# Patient Record
Sex: Female | Born: 1995 | Hispanic: No | Marital: Married | Smoking: Never smoker
Health system: Southern US, Community
[De-identification: ages and names within clinical notes are randomized; demographics above are authoritative.]

## PROBLEM LIST (undated history)

## (undated) DIAGNOSIS — B1910 Unspecified viral hepatitis B without hepatic coma: Secondary | ICD-10-CM

## (undated) HISTORY — DX: Unspecified viral hepatitis B without hepatic coma: B19.10

---
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
## 2015-01-08 ENCOUNTER — Other Ambulatory Visit: Payer: Medicaid Other

## 2015-01-08 DIAGNOSIS — B181 Chronic viral hepatitis B without delta-agent: Secondary | ICD-10-CM

## 2015-01-08 LAB — COMPREHENSIVE METABOLIC PANEL
ALT: 21 U/L (ref 0–35)
AST: 21 U/L (ref 0–37)
Albumin: 3.9 g/dL (ref 3.5–5.2)
Alkaline Phosphatase: 98 U/L (ref 39–117)
BUN: 10 mg/dL (ref 6–23)
CALCIUM: 9.1 mg/dL (ref 8.4–10.5)
CHLORIDE: 104 meq/L (ref 96–112)
CO2: 25 mEq/L (ref 19–32)
Creat: 0.61 mg/dL (ref 0.50–1.10)
Glucose, Bld: 101 mg/dL — ABNORMAL HIGH (ref 70–99)
POTASSIUM: 4.2 meq/L (ref 3.5–5.3)
Sodium: 141 mEq/L (ref 135–145)
Total Bilirubin: 0.3 mg/dL (ref 0.2–1.1)
Total Protein: 6.8 g/dL (ref 6.0–8.3)

## 2015-01-10 LAB — HEPATITIS B E ANTIGEN: Hepatitis Be Antigen: NONREACTIVE

## 2015-01-10 LAB — HEPATITIS B E ANTIBODY: Hepatitis Be Antibody: REACTIVE — AB

## 2015-01-10 LAB — HEPATITIS B DNA, ULTRAQUANTITATIVE, PCR
HEPATITIS B DNA: 17607 [IU]/mL — AB (ref <20)
Hepatitis B DNA (Calc): 102473 copies/mL — ABNORMAL HIGH (ref <116)

## 2015-01-22 ENCOUNTER — Ambulatory Visit (INDEPENDENT_AMBULATORY_CARE_PROVIDER_SITE_OTHER): Payer: Medicaid Other | Admitting: Internal Medicine

## 2015-01-22 DIAGNOSIS — B181 Chronic viral hepatitis B without delta-agent: Secondary | ICD-10-CM | POA: Diagnosis present

## 2015-01-22 NOTE — Progress Notes (Signed)
Patient ID: Brandi Cervantes, female   DOB: May 21, 1996, 19 y.o.   MRN: [id number]         [facility] for Infectious Disease  Patient Active Problem List   Diagnosis Date Noted  . Chronic hepatitis B 09/10/2012    Priority: High  . Yeast vaginitis 03/22/2014  . Lumbar strain 10/20/2013  . GERD (gastroesophageal reflux disease) 09/26/2012  . Anemia 09/26/2012  . Language barrier, cultural differences 09/10/2012  . Supervision of other high-risk pregnancy(V23.89) 09/10/2012  . Previous cesarean delivery affecting pregnancy, antepartum 09/07/2012    Patient's Medications  New Prescriptions   No medications on file  Previous Medications   FLUCONAZOLE (DIFLUCAN) 150 MG TABLET    Take 1 tablet (150 mg total) by mouth once.   IBUPROFEN (ADVIL,MOTRIN) 800 MG TABLET    Take 1 tablet (800 mg total) by mouth every 8 (eight) hours.   PRENATAL VIT-FE FUMARATE-FA (PRENATAL MULTIVITAMIN) TABS    Take 1 tablet by mouth daily at 12 noon.   TERCONAZOLE (TERAZOL 7) 0.4 % VAGINAL CREAM    Place 1 applicator vaginally at bedtime.  Modified Medications   No medications on file  Discontinued Medications   No medications on file    Subjective: Brandi Cervantes is in for her routine chronic hepatitis B followup visit with her husband and 2 children. She has been a little more fatigued recently. They recently lost their childcare so she had to stop school and stay home more. As a result she has been getting less activity and eating more and gaining weight.  Past Medical History  Diagnosis Date  . Hepatitis B     diagnosed in pregnancy    History  Substance Use Topics  . Smoking status: Never Smoker   . Smokeless tobacco: Never Used  . Alcohol Use: No    No family history on file.  No Known Allergies  Objective: Temp: 97.8 F (36.6 C) (07/26 1512) Temp Source: Oral (07/26 1512) BP: 110/68 mmHg (07/26 1512) Pulse Rate: 88 (07/26 1512)  Hepatitis B e antigen negative Hepatitis B e antibody  positive Hepatitis B DNA viral load 17607 international units/mL  CMP     Component Value Date/Time   NA 141 01/08/2015 1113   K 4.2 01/08/2015 1113   CL 104 01/08/2015 1113   CO2 25 01/08/2015 1113   GLUCOSE 101* 01/08/2015 1113   BUN 10 01/08/2015 1113   CREATININE 0.61 01/08/2015 1113   CALCIUM 9.1 01/08/2015 1113   PROT 6.8 01/08/2015 1113   ALBUMIN 3.9 01/08/2015 1113   AST 21 01/08/2015 1113   ALT 21 01/08/2015 1113   ALKPHOS 98 01/08/2015 1113   BILITOT 0.3 01/08/2015 1113   GFRNONAA >89 03/06/2014 1407   GFRAA >89 03/06/2014 1407     ULTRASOUND HEPATIC ELASTOGRAPHY 04/05/2014 TECHNIQUE:   IMPRESSION:  1. Normal sonographic appearance of the abdomen and liver.  Median hepatic shear wave velocity is calculated at 1.02 m/sec.  Corresponding Metavir fibrosis score is F0/F1.  Risk of fibrosis is minimal.  Follow-up: None required  Electronically Signed  By: Herbie Baltimore M.D.  On: 04/05/2014 10:38     Assessment: She has chronic e antigen negative hepatitis B. Given her young age, normal liver enzymes and normal ultrasound I will continue observation and repeat lab work every 6 months.   Plan: 1. followup after lab work in 6 months    Cliffton Asters, MD [facility] for Infectious Disease [facility] [phone] pager   [phone] cell  01/22/2015, 3:32 PM

## 2015-07-16 ENCOUNTER — Other Ambulatory Visit: Payer: Medicaid Other

## 2015-07-16 DIAGNOSIS — B181 Chronic viral hepatitis B without delta-agent: Secondary | ICD-10-CM

## 2015-07-16 LAB — COMPREHENSIVE METABOLIC PANEL
ALT: 15 U/L (ref 5–32)
AST: 16 U/L (ref 12–32)
Albumin: 3.7 g/dL (ref 3.6–5.1)
Alkaline Phosphatase: 90 U/L (ref 47–176)
BILIRUBIN TOTAL: 0.4 mg/dL (ref 0.2–1.1)
BUN: 9 mg/dL (ref 7–20)
CHLORIDE: 104 mmol/L (ref 98–110)
CO2: 27 mmol/L (ref 20–31)
CREATININE: 0.59 mg/dL (ref 0.50–1.00)
Calcium: 9.1 mg/dL (ref 8.9–10.4)
Glucose, Bld: 84 mg/dL (ref 65–99)
Potassium: 4.1 mmol/L (ref 3.8–5.1)
SODIUM: 140 mmol/L (ref 135–146)
TOTAL PROTEIN: 6.5 g/dL (ref 6.3–8.2)

## 2015-07-18 LAB — HEPATITIS B E ANTIGEN: HEPATITIS BE ANTIGEN: NONREACTIVE

## 2015-07-18 LAB — HEPATITIS B DNA, ULTRAQUANTITATIVE, PCR
Hepatitis B DNA (Calc): 4.55 Log IU/mL — ABNORMAL HIGH (ref <1.30)
Hepatitis B DNA: 35767 IU/mL — ABNORMAL HIGH (ref <20)

## 2015-07-18 LAB — HEPATITIS B E ANTIBODY: HEPATITIS BE ANTIBODY: REACTIVE — AB

## 2015-07-30 ENCOUNTER — Encounter: Payer: Self-pay | Admitting: Internal Medicine

## 2015-07-30 ENCOUNTER — Ambulatory Visit (INDEPENDENT_AMBULATORY_CARE_PROVIDER_SITE_OTHER): Payer: Medicaid Other | Admitting: Internal Medicine

## 2015-07-30 DIAGNOSIS — Z23 Encounter for immunization: Secondary | ICD-10-CM

## 2015-07-30 DIAGNOSIS — B181 Chronic viral hepatitis B without delta-agent: Secondary | ICD-10-CM | POA: Diagnosis not present

## 2015-07-30 NOTE — Progress Notes (Signed)
Patient ID: Brandi Cervantes, female   DOB: 1995/09/24, 20 y.o.   MRN: [id number] Patient ID: Brandi Cervantes, female   DOB: [birth year], 20 y.o.   MRN: [id number]         [facility] for Infectious Disease  Patient Active Problem List   Diagnosis Date Noted  . Chronic hepatitis B (HCC) 09/10/2012    Priority: High  . Yeast vaginitis 03/22/2014  . Lumbar strain 10/20/2013  . GERD (gastroesophageal reflux disease) 09/26/2012  . Anemia 09/26/2012  . Language barrier, cultural differences 09/10/2012  . Supervision of other high-risk pregnancy(V23.89) 09/10/2012  . Previous cesarean delivery affecting pregnancy, antepartum 09/07/2012    Patient's Medications  New Prescriptions   No medications on file  Previous Medications   FLUCONAZOLE (DIFLUCAN) 150 MG TABLET    Take 1 tablet (150 mg total) by mouth once.   IBUPROFEN (ADVIL,MOTRIN) 800 MG TABLET    Take 1 tablet (800 mg total) by mouth every 8 (eight) hours.   PRENATAL VIT-FE FUMARATE-FA (PRENATAL MULTIVITAMIN) TABS    Take 1 tablet by mouth daily at 12 noon.   TERCONAZOLE (TERAZOL 7) 0.4 % VAGINAL CREAM    Place 1 applicator vaginally at bedtime.  Modified Medications   No medications on file  Discontinued Medications   No medications on file    Subjective: Brandi Cervantes is in for her routine chronic hepatitis B followup visit with her husband and 2 children. She is feeling well. She's not had any abdominal pain, nausea or vomiting.  Past Medical History  Diagnosis Date  . Hepatitis B     diagnosed in pregnancy    Social History  Substance Use Topics  . Smoking status: Never Smoker   . Smokeless tobacco: Never Used  . Alcohol Use: No    No family history on file.  No Known Allergies  Objective: Temp: 98 F (36.7 C) (01/31 1606) Temp Source: Oral (01/31 1606) BP: 114/70 mmHg (01/31 1606) Pulse Rate: 88 (01/31 1606)   She is smiling and in good spirits as usual Abdomen: Soft and nontender without palpable liver edge  Lab  work 07/16/2015 Hepatitis B e antigen negative Hepatitis B e antibody positive Hepatitis B DNA viral load 35767 international units/mL  CMP     Component Value Date/Time   NA 140 07/16/2015 1553   K 4.1 07/16/2015 1553   CL 104 07/16/2015 1553   CO2 27 07/16/2015 1553   GLUCOSE 84 07/16/2015 1553   BUN 9 07/16/2015 1553   CREATININE 0.59 07/16/2015 1553   CALCIUM 9.1 07/16/2015 1553   PROT 6.5 07/16/2015 1553   ALBUMIN 3.7 07/16/2015 1553   AST 16 07/16/2015 1553   ALT 15 07/16/2015 1553   ALKPHOS 90 07/16/2015 1553   BILITOT 0.4 07/16/2015 1553   GFRNONAA >89 03/06/2014 1407   GFRAA >89 03/06/2014 1407     ULTRASOUND HEPATIC ELASTOGRAPHY 04/05/2014 TECHNIQUE:   IMPRESSION:  1. Normal sonographic appearance of the abdomen and liver.  Median hepatic shear wave velocity is calculated at 1.02 m/sec.  Corresponding Metavir fibrosis score is F0/F1.  Risk of fibrosis is minimal.  Follow-up: None required  Electronically Signed  By: Herbie Baltimore M.D.  On: 04/05/2014 10:38     Assessment: She has chronic e antigen negative hepatitis B. Given her young age, normal liver enzymes and stable viral load I will continue observation and repeat lab work every 6 months.   Plan: 1. followup after lab work in 6 months    Cliffton Asters, MD  [facility] [phone] pager   [phone] cell 07/30/2015, 4:27 PM

## 2016-03-03 ENCOUNTER — Other Ambulatory Visit: Payer: Medicaid Other

## 2016-03-17 ENCOUNTER — Ambulatory Visit: Payer: Medicaid Other | Admitting: Internal Medicine

## 2016-04-02 ENCOUNTER — Other Ambulatory Visit: Payer: Medicaid Other

## 2016-04-02 DIAGNOSIS — B181 Chronic viral hepatitis B without delta-agent: Secondary | ICD-10-CM

## 2016-04-03 LAB — COMPREHENSIVE METABOLIC PANEL
ALBUMIN: 4 g/dL (ref 3.6–5.1)
ALK PHOS: 91 U/L (ref 33–115)
ALT: 14 U/L (ref 6–29)
AST: 19 U/L (ref 10–30)
BILIRUBIN TOTAL: 0.3 mg/dL (ref 0.2–1.2)
BUN: 16 mg/dL (ref 7–25)
CHLORIDE: 104 mmol/L (ref 98–110)
CO2: 22 mmol/L (ref 20–31)
CREATININE: 0.79 mg/dL (ref 0.50–1.10)
Calcium: 9.7 mg/dL (ref 8.6–10.2)
Glucose, Bld: 92 mg/dL (ref 65–99)
Potassium: 4.1 mmol/L (ref 3.5–5.3)
SODIUM: 140 mmol/L (ref 135–146)
TOTAL PROTEIN: 6.9 g/dL (ref 6.1–8.1)

## 2016-04-03 LAB — HEPATITIS B DNA, ULTRAQUANTITATIVE, PCR
HEPATITIS B DNA: 60880 [IU]/mL — AB (ref <20)
Hepatitis B DNA (Calc): 4.78 Log IU/mL — ABNORMAL HIGH (ref <1.30)

## 2016-04-06 LAB — HEPATITIS B E ANTIBODY: Hepatitis Be Antibody: REACTIVE — AB

## 2016-04-06 LAB — HEPATITIS B E ANTIGEN: Hepatitis Be Antigen: NONREACTIVE

## 2016-04-16 ENCOUNTER — Ambulatory Visit: Payer: Medicaid Other | Admitting: Internal Medicine

## 2016-04-20 ENCOUNTER — Ambulatory Visit (INDEPENDENT_AMBULATORY_CARE_PROVIDER_SITE_OTHER): Payer: Medicaid Other | Admitting: Internal Medicine

## 2016-04-20 DIAGNOSIS — B181 Chronic viral hepatitis B without delta-agent: Secondary | ICD-10-CM | POA: Diagnosis present

## 2016-04-20 DIAGNOSIS — Z23 Encounter for immunization: Secondary | ICD-10-CM

## 2016-04-20 NOTE — Progress Notes (Signed)
Patient ID: Brandi Cervantes, female   DOB: 07-Feb-1996, 20 y.o.   MRN: [id number]         [facility] for Infectious Disease  Patient Active Problem List   Diagnosis Date Noted  . Chronic hepatitis B (HCC) 09/10/2012    Priority: High  . Yeast vaginitis 03/22/2014  . Lumbar strain 10/20/2013  . GERD (gastroesophageal reflux disease) 09/26/2012  . Anemia 09/26/2012  . Language barrier, cultural differences 09/10/2012  . Supervision of other high-risk pregnancy(V23.89) 09/10/2012  . Previous cesarean delivery affecting pregnancy, antepartum 09/07/2012    Patient's Medications  New Prescriptions   No medications on file  Previous Medications   FLUCONAZOLE (DIFLUCAN) 150 MG TABLET    Take 1 tablet (150 mg total) by mouth once.   IBUPROFEN (ADVIL,MOTRIN) 800 MG TABLET    Take 1 tablet (800 mg total) by mouth every 8 (eight) hours.   PRENATAL VIT-FE FUMARATE-FA (PRENATAL MULTIVITAMIN) TABS    Take 1 tablet by mouth daily at 12 noon.   TERCONAZOLE (TERAZOL 7) 0.4 % VAGINAL CREAM    Place 1 applicator vaginally at bedtime.  Modified Medications   No medications on file  Discontinued Medications   No medications on file    Subjective: Marguetta is in for her routine chronic hepatitis B followup visit with her husband and 2 children. She is feeling well. She's not had any abdominal pain, nausea or vomiting. She occasionally has some back pain. It is mild. She believes it is probably related to carrying her children.  Past Medical History:  Diagnosis Date  . Hepatitis B    diagnosed in pregnancy    Social History  Substance Use Topics  . Smoking status: Never Smoker  . Smokeless tobacco: Never Used  . Alcohol use No    No family history on file.  No Known Allergies  Objective: Temp: 98.3 F (36.8 C) (10/23 1613) Temp Source: Oral (10/23 1613) BP: 108/72 (10/23 1613) Pulse Rate: 84 (10/23 1613)   She is smiling and in good spirits as usual Abdomen: Soft and nontender  without palpable liver edge  Lab work 04/02/2016 Hepatitis B e antigen negative Hepatitis B e antibody positive Hepatitis B DNA viral load 60880 international units/mL  CMP     Component Value Date/Time   NA 140 04/02/2016 1543   K 4.1 04/02/2016 1543   CL 104 04/02/2016 1543   CO2 22 04/02/2016 1543   GLUCOSE 92 04/02/2016 1543   BUN 16 04/02/2016 1543   CREATININE 0.79 04/02/2016 1543   CALCIUM 9.7 04/02/2016 1543   PROT 6.9 04/02/2016 1543   ALBUMIN 4.0 04/02/2016 1543   AST 19 04/02/2016 1543   ALT 14 04/02/2016 1543   ALKPHOS 91 04/02/2016 1543   BILITOT 0.3 04/02/2016 1543   GFRNONAA >89 03/06/2014 1407   GFRAA >89 03/06/2014 1407     ULTRASOUND HEPATIC ELASTOGRAPHY 04/05/2014 TECHNIQUE:   IMPRESSION:  1. Normal sonographic appearance of the abdomen and liver.  Median hepatic shear wave velocity is calculated at 1.02 m/sec.  Corresponding Metavir fibrosis score is F0/F1.  Risk of fibrosis is minimal.  Follow-up: None required  Electronically Signed  By: Herbie Baltimore M.D.  On: 04/05/2014 10:38     Assessment: She has chronic e antigen negative hepatitis B. Given her young age, normal liver enzymes and relatively stable viral load I will continue observation and repeat lab work every 6 months.   Plan: 1. followup after lab work in 6 months    Jonny Ruiz  Orvan Falconerampbell, MD [facility] [phone] pager   [phone] cell 04/20/2016, 4:34 PM

## 2016-09-28 ENCOUNTER — Encounter: Payer: Self-pay | Admitting: Obstetrics & Gynecology

## 2016-09-28 ENCOUNTER — Ambulatory Visit (INDEPENDENT_AMBULATORY_CARE_PROVIDER_SITE_OTHER): Payer: Medicaid Other | Admitting: Obstetrics & Gynecology

## 2016-09-28 VITALS — BP 124/73 | HR 101 | Wt 173.7 lb

## 2016-09-28 DIAGNOSIS — N938 Other specified abnormal uterine and vaginal bleeding: Secondary | ICD-10-CM

## 2016-09-28 MED ORDER — NORGESTREL-ETHINYL ESTRADIOL 0.3-30 MG-MCG PO TABS: 1.0000 | ORAL_TABLET | Freq: Every day | ORAL | 11 refills | Status: DC

## 2016-09-28 NOTE — Progress Notes (Addendum)
   Subjective:    Patient ID: Brandi Cervantes, female    DOB: 02-25-1996, 21 y.o.   MRN: [id number]  HPI 49 M [place] P2 (5 and 4 yo kids) here today with the issue of  With LBP for 1 month and the issue of long periods for up to 2 weeks per month. She has a Mirena IUD for about 4 years, since last delivery.  She would also like her IUD strings cut as her husband feels them with sex.  Review of Systems     Objective:   Physical Exam WNWHAFNAD Breathing, conversing, and ambulating normally NSSA, NT, mobile, no adnexal masses IUD strings cut flush with os     Assessment & Plan:  DUB with Mirena- check TSH, cbc I have reassured her that if her labs are normal then the cause of the DUB is the Mirena. I have offered to prescribe OCPs I will recheck her LFTs in a month due to her chronic hep B

## 2016-10-01 ENCOUNTER — Other Ambulatory Visit: Payer: Medicaid Other

## 2016-10-01 DIAGNOSIS — B181 Chronic viral hepatitis B without delta-agent: Secondary | ICD-10-CM

## 2016-10-01 LAB — COMPREHENSIVE METABOLIC PANEL
ALBUMIN: 4 g/dL (ref 3.6–5.1)
ALT: 20 U/L (ref 6–29)
AST: 19 U/L (ref 10–30)
Alkaline Phosphatase: 78 U/L (ref 33–115)
BILIRUBIN TOTAL: 0.4 mg/dL (ref 0.2–1.2)
BUN: 16 mg/dL (ref 7–25)
CHLORIDE: 106 mmol/L (ref 98–110)
CO2: 26 mmol/L (ref 20–31)
CREATININE: 0.77 mg/dL (ref 0.50–1.10)
Calcium: 8.9 mg/dL (ref 8.6–10.2)
Glucose, Bld: 97 mg/dL (ref 65–99)
Potassium: 4.1 mmol/L (ref 3.5–5.3)
SODIUM: 139 mmol/L (ref 135–146)
TOTAL PROTEIN: 6.7 g/dL (ref 6.1–8.1)

## 2016-10-04 LAB — HEPATITIS B DNA, ULTRAQUANTITATIVE, PCR
Hepatitis B DNA (Calc): 4.7 Log IU/mL — ABNORMAL HIGH
Hepatitis B DNA: 50500 IU/mL — ABNORMAL HIGH

## 2016-10-05 ENCOUNTER — Other Ambulatory Visit: Payer: Medicaid Other

## 2016-10-05 LAB — HEPATITIS B E ANTIGEN: HEPATITIS BE ANTIGEN: NONREACTIVE

## 2016-10-05 LAB — HEPATITIS B E ANTIBODY: Hepatitis Be Antibody: REACTIVE — AB

## 2016-10-19 ENCOUNTER — Ambulatory Visit: Payer: Medicaid Other | Admitting: Internal Medicine

## 2016-10-23 ENCOUNTER — Ambulatory Visit (INDEPENDENT_AMBULATORY_CARE_PROVIDER_SITE_OTHER): Payer: Medicaid Other | Admitting: Obstetrics & Gynecology

## 2016-10-23 VITALS — BP 114/69 | HR 84 | Wt 172.0 lb

## 2016-10-23 DIAGNOSIS — N938 Other specified abnormal uterine and vaginal bleeding: Secondary | ICD-10-CM

## 2016-10-23 NOTE — Progress Notes (Signed)
Pt reports that bleeding has improved on birth control pills.

## 2016-10-23 NOTE — Progress Notes (Signed)
   Subjective:    Patient ID: Namiko Pritts, female    DOB: May 12, 1996, 21 y.o.   MRN: [id number]  HPI 21 yo M lady here for follow up for DUB with Mirena. I gave her a prescription for OCPs which she started taking. Her bleeding resolved. However, she spoke with her mother [place] who convinced her that the OCPs might be harmful for her so she stopped taking them. Her husband is requesting a new Mirena (hers is 5 1/21 years old).   Review of Systems     Objective:   Physical Exam WNWHAFNAD Breathing, conversing, and ambulating normally        Assessment & Plan:  LFTs (H/o Hep B), CBC, TSH (as these were not drawn at her last visit) I have offered her watchful waiting, use of OCPs along with Mirena to help her DUB.  He would like her to have a new IUD as soon as the old one expires

## 2016-10-24 LAB — CBC
Hematocrit: 40.4 % (ref 34.0–46.6)
Hemoglobin: 13.3 g/dL (ref 11.1–15.9)
MCH: 28.5 pg (ref 26.6–33.0)
MCHC: 32.9 g/dL (ref 31.5–35.7)
MCV: 87 fL (ref 79–97)
PLATELETS: 267 10*3/uL (ref 150–379)
RBC: 4.66 x10E6/uL (ref 3.77–5.28)
RDW: 13.7 % (ref 12.3–15.4)
WBC: 5 10*3/uL (ref 3.4–10.8)

## 2016-10-24 LAB — ALT: ALT: 35 IU/L — ABNORMAL HIGH (ref 0–32)

## 2016-10-24 LAB — AST: AST: 23 IU/L (ref 0–40)

## 2016-10-24 LAB — TSH: TSH: 1.32 u[IU]/mL (ref 0.450–4.500)

## 2016-10-29 ENCOUNTER — Ambulatory Visit (INDEPENDENT_AMBULATORY_CARE_PROVIDER_SITE_OTHER): Payer: Medicaid Other | Admitting: Internal Medicine

## 2016-10-29 ENCOUNTER — Encounter: Payer: Self-pay | Admitting: Internal Medicine

## 2016-10-29 DIAGNOSIS — B181 Chronic viral hepatitis B without delta-agent: Secondary | ICD-10-CM

## 2016-10-29 NOTE — Progress Notes (Signed)
[facility]  Patient Active Problem List   Diagnosis Date Noted  . Chronic hepatitis B (HCC) 09/10/2012    Priority: High  . Yeast vaginitis 03/22/2014  . Lumbar strain 10/20/2013  . GERD (gastroesophageal reflux disease) 09/26/2012  . Anemia 09/26/2012  . Language barrier, cultural differences 09/10/2012  . Supervision of other high-risk pregnancy(V23.89) 09/10/2012  . Previous cesarean delivery affecting pregnancy, antepartum 09/07/2012    Patient's Medications  New Prescriptions   No medications on file  Previous Medications   IBUPROFEN (ADVIL,MOTRIN) 800 MG TABLET    Take 1 tablet (800 mg total) by mouth every 8 (eight) hours.   NORGESTREL-ETHINYL ESTRADIOL (LO/OVRAL,CRYSELLE) 0.3-30 MG-MCG TABLET    Take 1 tablet by mouth daily.  Modified Medications   No medications on file  Discontinued Medications   No medications on file    Subjective: Erick Blinksuqa is in with her husband for her routine follow-up visit. She has occasional epigastric and back pain that she believes is related to standing at her work. It is not very severe and she does not need to take anything for it. She's not having any nausea, vomiting, indigestion and her appetite is good. She states that she and her husband both need to work and she is worried that she will lose her Medicaid soon.  Review of Systems: Review of Systems  Constitutional: Negative for chills, diaphoresis, fever and weight loss.  Gastrointestinal: Positive for abdominal pain. Negative for constipation, diarrhea, heartburn, nausea and vomiting.    Past Medical History:  Diagnosis Date  . Hepatitis B    diagnosed in pregnancy    Social History  Substance Use Topics  . Smoking status: Never Smoker  . Smokeless tobacco: Never Used  . Alcohol use No    No family history on file.  No Known Allergies  Objective: Vitals:   10/29/16 0918  Weight: 168 lb (76.2 kg)   There is no height or weight  on file to calculate BMI.  Physical Exam  Constitutional: She is oriented to person, place, and time.  She is smiling and in good spirits as usual.  Abdominal: Soft. She exhibits no distension and no mass. There is no tenderness.  Neurological: She is alert and oriented to person, place, and time.  Skin: No rash noted.  Psychiatric: Mood and affect normal.    Lab Results CMP     Component Value Date/Time   NA 139 10/01/2016 1606   K 4.1 10/01/2016 1606   CL 106 10/01/2016 1606   CO2 26 10/01/2016 1606   GLUCOSE 97 10/01/2016 1606   BUN 16 10/01/2016 1606   CREATININE 0.77 10/01/2016 1606   CALCIUM 8.9 10/01/2016 1606   PROT 6.7 10/01/2016 1606   ALBUMIN 4.0 10/01/2016 1606   AST 23 10/23/2016 1057   ALT 35 (H) 10/23/2016 1057   ALKPHOS 78 10/01/2016 1606   BILITOT 0.4 10/01/2016 1606   GFRNONAA >89 03/06/2014 1407   GFRAA >89 03/06/2014 1407  Hepatitis B e antigen 10/01/2016: Negative Hepatitis B e antibody 10/01/2016: Positive Hepatitis bDNA viral load 10/01/2016 35,767 IU/ml   Problem List Items Addressed This Visit      High   Chronic hepatitis B (HCC)    She has chronic hepatitis B with persistent e antigen negative status. She has moderate viral activation and for the first time since I have been seeing her she has some very mild elevation of her ALT. She had  a normal ultrasound with elastography 3 years ago. I will repeat that now and see her back after repeat liver enzymes in 2 months.      Relevant Orders   US ABDOMEN COMPLETE W/ELASTOGRAPHY   Comprehensive metabolic panel       Cliffton Asters, MD [facility] for Infectious Disease [facility] [phone] pager   [phone] cell 10/29/2016, 9:37 AM

## 2016-10-29 NOTE — Assessment & Plan Note (Signed)
She has chronic hepatitis B with persistent e antigen negative status. She has moderate viral activation and for the first time since I have been seeing her she has some very mild elevation of her ALT. She had a normal ultrasound with elastography 3 years ago. I will repeat that now and see her back after repeat liver enzymes in 2 months.

## 2016-11-13 DIAGNOSIS — N938 Other specified abnormal uterine and vaginal bleeding: Secondary | ICD-10-CM | POA: Insufficient documentation

## 2016-11-13 DIAGNOSIS — B181 Chronic viral hepatitis B without delta-agent: Secondary | ICD-10-CM | POA: Diagnosis not present

## 2016-11-13 DIAGNOSIS — M62830 Muscle spasm of back: Secondary | ICD-10-CM | POA: Diagnosis not present

## 2016-11-25 ENCOUNTER — Telehealth: Payer: Self-pay | Admitting: *Deleted

## 2016-11-25 NOTE — Telephone Encounter (Signed)
I have checked with Samule DryAmy Barnes in revenue cycle/billing.  She said Medicaid will cover insertion of another device.

## 2016-11-25 NOTE — Telephone Encounter (Addendum)
Per Dr. Marice Potterove patient needs to stop taking birth control pills because her AST level has increased. Attempted to call patient but was unable to reach her. (Per Dr. Marice Potterove patient will likely want her iud replaced as she thinks it is the reason that she is beginning to bleed. Need to check with Tresa EndoKelly if Medicaid will cover early reinsertion, current iud has been in place for 3 years.)

## 2016-11-30 NOTE — Telephone Encounter (Addendum)
Called pt and left message stating that I am calling with test result information and recommendation for plan of care from Dr. Marice Potterove. Please call back and leave message stating whether a detailed message can be left on her voicemail.  6/6  0930  Pt and husband presented to office today in response to my telephone message. I informed pt of test result information regarding slightly elevated liver enzyme and need to stop taking OCP's. Pt was already aware of this information from recent visit to ID clinic w/Dr. Orvan Falconerampbell and she has stopped taking the OCP's. I also informed pt that we have verified that Medicaid will cover the cost of a replacement IUD.  Pt and husband were ecstatic with this information and requested the soonest appt possible with any provider. Appt was given for tomorrow @ 0940.

## 2016-12-03 ENCOUNTER — Ambulatory Visit: Payer: Medicaid Other | Admitting: Obstetrics & Gynecology

## 2016-12-03 ENCOUNTER — Telehealth: Payer: Self-pay | Admitting: *Deleted

## 2016-12-03 NOTE — Telephone Encounter (Signed)
PA initiated for patient's Abdominal ultrasound complete with elastography.  [employer] requests additional clinical information to be faxed to [phone].  RN faxed last 2 years of notes, last elastography report and labs as directed with patient name, date of birth, and case ID [id number] on each page. Request will be approved/denied within 48 business hours. Patient's elastography scheduled for 6/14 pending outcome. Andree CossHowell, Jonel Sick M, RN

## 2016-12-07 ENCOUNTER — Telehealth: Payer: Self-pay | Admitting: *Deleted

## 2016-12-07 ENCOUNTER — Ambulatory Visit (INDEPENDENT_AMBULATORY_CARE_PROVIDER_SITE_OTHER): Payer: Medicaid Other | Admitting: Obstetrics & Gynecology

## 2016-12-07 ENCOUNTER — Encounter: Payer: Self-pay | Admitting: Obstetrics & Gynecology

## 2016-12-07 VITALS — BP 108/66 | HR 72 | Wt 177.6 lb

## 2016-12-07 DIAGNOSIS — Z30433 Encounter for removal and reinsertion of intrauterine contraceptive device: Secondary | ICD-10-CM | POA: Diagnosis present

## 2016-12-07 MED ORDER — LEVONORGESTREL 18.6 MCG/DAY IU IUD
INTRAUTERINE_SYSTEM | Freq: Once | INTRAUTERINE | Status: AC
  Administered 2016-12-07: 16:00:00 via INTRAUTERINE

## 2016-12-07 NOTE — Progress Notes (Signed)
   Subjective:    Patient ID: Brandi Cervantes, female    DOB: 03/20/1996, 21 y.o.   MRN: [id number]  HPI 21 yo MAP2 here for a new Mirena.   Review of Systems     Objective:   Physical Exam Consent signed, Time out procedure done. I removed the intact IUD easily. Cervix prepped with betadine and grasped with a single tooth tenaculum. Liletta was easily placed and the strings were cut to 3-4 cm. Uterus sounded to 9 cm. She tolerated the procedure well.  +    Assessment & Plan:  Contraception- Mirena RTC 2 month

## 2016-12-07 NOTE — Telephone Encounter (Signed)
Wanting to find out about ABD U/S scheduling/approval.  RN advised that Medicaid is reviewing the patient's medical records prior to approving payment for the ABD U/S.  It may take until tomorrow, 12/08/16, [facility]  [facility] will call the patient when we hear from Medicaid.  RN noted that the patient has an appointment scheduled with [facility] for this afternoon.  The notes section of the appointment said that the patient was being seen re: IUD replacement.  Patient acknowledged that appt for this afternoon.

## 2016-12-08 NOTE — Telephone Encounter (Signed)
Ultrasound approved. RN left message for patient confirming appointment.

## 2016-12-08 NOTE — Telephone Encounter (Signed)
Ultrasound approved, left message for patient confirming her appointment.   Approval [id number].

## 2016-12-10 ENCOUNTER — Ambulatory Visit (HOSPITAL_COMMUNITY)
Admission: RE | Admit: 2016-12-10 | Discharge: 2016-12-10 | Disposition: A | Discharge status: Home / Self Care | Payer: Medicaid Other | Source: Ambulatory Visit | Attending: Internal Medicine | Admitting: Internal Medicine

## 2016-12-10 DIAGNOSIS — B181 Chronic viral hepatitis B without delta-agent: Secondary | ICD-10-CM | POA: Insufficient documentation

## 2016-12-22 ENCOUNTER — Ambulatory Visit (INDEPENDENT_AMBULATORY_CARE_PROVIDER_SITE_OTHER): Payer: Medicaid Other | Admitting: Internal Medicine

## 2016-12-22 ENCOUNTER — Encounter: Payer: Self-pay | Admitting: Internal Medicine

## 2016-12-22 DIAGNOSIS — B181 Chronic viral hepatitis B without delta-agent: Secondary | ICD-10-CM

## 2016-12-22 NOTE — Progress Notes (Signed)
[facility]  Patient Active Problem List   Diagnosis Date Noted  . Chronic hepatitis B (HCC) 09/10/2012    Priority: High  . Yeast vaginitis 03/22/2014  . Lumbar strain 10/20/2013  . GERD (gastroesophageal reflux disease) 09/26/2012  . Anemia 09/26/2012  . Language barrier, cultural differences 09/10/2012  . Supervision of other high-risk pregnancy(V23.89) 09/10/2012  . Previous cesarean delivery affecting pregnancy, antepartum 09/07/2012    Patient's Medications  New Prescriptions   No medications on file  Previous Medications   IBUPROFEN (ADVIL,MOTRIN) 800 MG TABLET    Take 1 tablet (800 mg total) by mouth every 8 (eight) hours.  Modified Medications   No medications on file  Discontinued Medications   NORGESTREL-ETHINYL ESTRADIOL (LO/OVRAL,CRYSELLE) 0.3-30 MG-MCG TABLET    Take 1 tablet by mouth daily.    Subjective: Brandi Cervantes is in with her husband for her routine follow-up visit. She has occasional epigastric and back pain that she believes is related to standing at her work. It is not very severe and she does not need to take anything for it. She's not having any nausea, vomiting, indigestion and her appetite is good. She states that she and her husband both need to work and she is worried that she will lose her Medicaid soon.  Review of Systems: Review of Systems  Constitutional: Negative for chills, diaphoresis, fever and weight loss.  Gastrointestinal: Positive for abdominal pain. Negative for constipation, diarrhea, heartburn, nausea and vomiting.    Past Medical History:  Diagnosis Date  . Hepatitis B    diagnosed in pregnancy    Social History  Substance Use Topics  . Smoking status: Never Smoker  . Smokeless tobacco: Never Used  . Alcohol use No    No family history on file.  No Known Allergies  Objective: Vitals:   12/22/16 1002  BP: 103/69  Pulse: 75  Temp: 97.7 F (36.5 C)  TempSrc: Oral  Weight: 171 lb  (77.6 kg)  Height: 5\' 1"  (1.549 m)   Body mass index is 32.31 kg/m.  Physical Exam  Constitutional: She is oriented to person, place, and time.  She is smiling and in good spirits as usual.  Abdominal: Soft. She exhibits no distension and no mass. There is no tenderness.  Neurological: She is alert and oriented to person, place, and time.  Skin: No rash noted.  Psychiatric: Mood and affect normal.    Lab Results CMP     Component Value Date/Time   NA 139 10/01/2016 1606   K 4.1 10/01/2016 1606   CL 106 10/01/2016 1606   CO2 26 10/01/2016 1606   GLUCOSE 97 10/01/2016 1606   BUN 16 10/01/2016 1606   CREATININE 0.77 10/01/2016 1606   CALCIUM 8.9 10/01/2016 1606   PROT 6.7 10/01/2016 1606   ALBUMIN 4.0 10/01/2016 1606   AST 23 10/23/2016 1057   ALT 35 (H) 10/23/2016 1057   ALKPHOS 78 10/01/2016 1606   BILITOT 0.4 10/01/2016 1606   GFRNONAA >89 03/06/2014 1407   GFRAA >89 03/06/2014 1407  Hepatitis B e antigen 10/01/2016: Negative Hepatitis B e antibody 10/01/2016: Positive Hepatitis bDNA viral load 10/01/2016 50,500 IU/ml  EXAM: ULTRASOUND ABDOMEN 12/10/2016  ULTRASOUND HEPATIC ELASTOGRAPHY  TECHNIQUE: Sonography of the upper abdomen was performed. In addition, ultrasound elastography evaluation of the liver was performed. A region of interest was placed within the right lobe of the liver. Following application of a compressive sonographic pulse, shear waves were  detected in the adjacent hepatic tissue and the shear wave velocity was calculated. Multiple assessments were performed at the selected site. Median shear wave velocity is correlated to a Metavir fibrosis score.  COMPARISON:  None.  FINDINGS: ULTRASOUND ABDOMEN  Gallbladder: No gallstones or wall thickening visualized. No sonographic Murphy sign noted by sonographer.  Common bile duct: Diameter: Normal, 3 mm.  Liver: No focal lesion identified. Within normal limits in parenchymal  echogenicity.  IVC: No abnormality visualized.  Pancreas: Obscured by overlying bowel gas.  Spleen: Size and appearance within normal limits.  Right Kidney: Length: 11.0 cm. Echogenicity within normal limits. No mass or hydronephrosis visualized.  Left Kidney: Length: 10.6 cm. Echogenicity within normal limits. No mass or hydronephrosis visualized.  Abdominal aorta: No aneurysm visualized.  Other findings: None.  ULTRASOUND HEPATIC ELASTOGRAPHY  Device: Siemens Helix VTQ  Patient position: Left Lateral Decubitus  Transducer 6C1  Number of measurements: 10  Hepatic segment:  8  Median velocity:   1.21  m/sec  IQR: 0.15  IQR/Median velocity ratio: 0.12  Corresponding Metavir fibrosis score:  F2 + some F3  Risk of fibrosis: Moderate  Limitations of exam: None  Pertinent findings noted on other imaging exams:  None  Please note that abnormal shear wave velocities may also be identified in clinical settings other than with hepatic fibrosis, such as: acute hepatitis, elevated right heart and central venous pressures including use of beta blockers, veno-occlusive disease (Budd-Chiari), infiltrative processes such as mastocytosis/amyloidosis/infiltrative tumor, extrahepatic cholestasis, in the post-prandial state, and liver transplantation. Correlation with patient history, laboratory data, and clinical condition recommended.  IMPRESSION: ULTRASOUND ABDOMEN: Normal abdominal ultrasound.  ULTRASOUND HEPATIC ELASTOGRAPHY:  Median hepatic shear wave velocity is calculated at 1.21 m/sec.  Corresponding Metavir fibrosis score is  F2 + some F3.  Risk of fibrosis is Moderate.  Follow-up: Additional testing appropriate   Electronically Signed   By: Jeronimo Greaves M.D.   On: 12/10/2016 10:03    Problem List Items Addressed This Visit      High   Chronic hepatitis B (HCC)    She has chronic e antigen negative hepatitis B. She is  treatment nave. Her ALT has recently gone up just above normal to 35. Her DNA viral load is up some to 50,500. I repeated her ultrasound with elastography. The appearance of the liver was normal but her fibrosis score was increased to F2/F3. This may represent progressive fibrosis that would be an indication for starting antiviral therapy. However I feel it is best to obtain ultrasound-guided liver biopsies to assess fibrosis before committing her to lifelong antiviral therapy.          Cliffton Asters, MD [facility] [phone] pager   [phone] cell 12/30/2016, 9:47 AM

## 2016-12-30 ENCOUNTER — Other Ambulatory Visit: Payer: Self-pay | Admitting: Internal Medicine

## 2016-12-30 DIAGNOSIS — B181 Chronic viral hepatitis B without delta-agent: Secondary | ICD-10-CM

## 2016-12-30 NOTE — Assessment & Plan Note (Signed)
She has chronic e antigen negative hepatitis B. She is treatment nave. Her ALT has recently gone up just above normal to 35. Her DNA viral load is up some to 50,500. I repeated her ultrasound with elastography. The appearance of the liver was normal but her fibrosis score was increased to F2/F3. This may represent progressive fibrosis that would be an indication for starting antiviral therapy. However I feel it is best to obtain ultrasound-guided liver biopsies to assess fibrosis before committing her to lifelong antiviral therapy.

## 2016-12-30 NOTE — Progress Notes (Signed)
Brandi Cervantes has chronic e Ag negative hepatitis B. she has recently had some slight elevation of her AST. Her DNA viral load has also increased. 2 years ago her elastography fibrosis score was at F0/F1. This was repeated recently and was reported as F2/F3. I would like to obtain liver biopsies to assess the degree of fibrosis before committing her to lifelong antiviral treatment for her hepatitis B.

## 2017-01-01 ENCOUNTER — Telehealth: Payer: Self-pay | Admitting: *Deleted

## 2017-01-01 NOTE — Telephone Encounter (Signed)
-----   Message from Cliffton AstersJohn Campbell, MD sent at 12/30/2016  9:40 AM EDT ----- Regarding: liver biopsy I discussed the need for a liver biopsy with Tu[place] and her husband at their recent visit. I put the order in today. Please let her know that someone from radiology will be contacting her soon.  Jonny RuizJohn

## 2017-01-01 NOTE — Telephone Encounter (Signed)
Called and left patient a voice mail to return the call regarding a test that Dr. Orvan Falconerampbell is arranging for her. [facility] triage please inform patient of MD message below. Brandi MolaJacqueline Cockerham

## 2017-01-04 NOTE — Telephone Encounter (Signed)
Radiology spoke with patient's husband, this is scheduled for 7/19.

## 2017-01-12 ENCOUNTER — Other Ambulatory Visit: Payer: Self-pay | Admitting: Radiology

## 2017-01-13 ENCOUNTER — Other Ambulatory Visit: Payer: Self-pay | Admitting: General Surgery

## 2017-01-13 ENCOUNTER — Other Ambulatory Visit: Payer: Self-pay | Admitting: Physician Assistant

## 2017-01-14 ENCOUNTER — Ambulatory Visit (HOSPITAL_COMMUNITY)
Admission: RE | Admit: 2017-01-14 | Discharge: 2017-01-14 | Disposition: A | Discharge status: Home / Self Care | Payer: Medicaid Other | Source: Ambulatory Visit | Attending: Internal Medicine | Admitting: Internal Medicine

## 2017-01-14 ENCOUNTER — Encounter (HOSPITAL_COMMUNITY): Payer: Self-pay

## 2017-01-14 DIAGNOSIS — B181 Chronic viral hepatitis B without delta-agent: Secondary | ICD-10-CM | POA: Insufficient documentation

## 2017-01-14 DIAGNOSIS — B1910 Unspecified viral hepatitis B without hepatic coma: Secondary | ICD-10-CM | POA: Diagnosis not present

## 2017-01-14 LAB — CBC
HEMATOCRIT: 37.6 % (ref 36.0–46.0)
HEMATOCRIT: 37.3 % (ref 36.0–46.0)
HEMOGLOBIN: 12.5 g/dL (ref 12.0–15.0)
HEMOGLOBIN: 12.3 g/dL (ref 12.0–15.0)
MCH: 28.5 pg (ref 26.0–34.0)
MCH: 28.3 pg (ref 26.0–34.0)
MCHC: 33.2 g/dL (ref 30.0–36.0)
MCHC: 33 g/dL (ref 30.0–36.0)
MCV: 85.6 fL (ref 78.0–100.0)
MCV: 85.9 fL (ref 78.0–100.0)
Platelets: 170 10*3/uL (ref 150–400)
Platelets: 181 10*3/uL (ref 150–400)
RBC: 4.39 MIL/uL (ref 3.87–5.11)
RBC: 4.34 MIL/uL (ref 3.87–5.11)
RDW: 13.4 % (ref 11.5–15.5)
RDW: 13.3 % (ref 11.5–15.5)
WBC: 5.5 10*3/uL (ref 4.0–10.5)
WBC: 5.2 10*3/uL (ref 4.0–10.5)

## 2017-01-14 LAB — PREGNANCY, URINE: PREG TEST UR: NEGATIVE

## 2017-01-14 LAB — APTT: aPTT: 29 seconds (ref 24–36)

## 2017-01-14 LAB — PROTIME-INR
INR: 1.09
Prothrombin Time: 14.2 seconds (ref 11.4–15.2)

## 2017-01-14 MED ORDER — MIDAZOLAM HCL 2 MG/2ML IJ SOLN
INTRAMUSCULAR | Status: AC
  Filled 2017-01-14: qty 2

## 2017-01-14 MED ORDER — OXYCODONE HCL 5 MG PO TABS
5.0000 mg | ORAL_TABLET | Freq: Once | ORAL | Status: AC
  Administered 2017-01-14: 5 mg via ORAL

## 2017-01-14 MED ORDER — FENTANYL CITRATE (PF) 100 MCG/2ML IJ SOLN
INTRAMUSCULAR | Status: AC
  Filled 2017-01-14: qty 2

## 2017-01-14 MED ORDER — HYDROCODONE-ACETAMINOPHEN 5-325 MG PO TABS: 1.0000 | ORAL_TABLET | Freq: Once | ORAL | Status: DC

## 2017-01-14 MED ORDER — FENTANYL CITRATE (PF) 100 MCG/2ML IJ SOLN
INTRAMUSCULAR | Status: AC | PRN
  Administered 2017-01-14: 50 ug via INTRAVENOUS
  Administered 2017-01-14 (×2): 25 ug via INTRAVENOUS

## 2017-01-14 MED ORDER — OXYCODONE HCL 5 MG PO TABS
ORAL_TABLET | ORAL | Status: AC
  Administered 2017-01-14: 5 mg via ORAL
  Filled 2017-01-14: qty 1

## 2017-01-14 MED ORDER — OXYCODONE HCL 5 MG PO TABS
ORAL_TABLET | ORAL | Status: AC
  Filled 2017-01-14: qty 1

## 2017-01-14 MED ORDER — GELATIN ABSORBABLE 12-7 MM EX MISC
CUTANEOUS | Status: AC
  Filled 2017-01-14: qty 1

## 2017-01-14 MED ORDER — MIDAZOLAM HCL 2 MG/2ML IJ SOLN
INTRAMUSCULAR | Status: AC | PRN
  Administered 2017-01-14 (×2): 0.5 mg via INTRAVENOUS
  Administered 2017-01-14: 1 mg via INTRAVENOUS

## 2017-01-14 MED ORDER — SODIUM CHLORIDE 0.9 % IV SOLN: INTRAVENOUS | Status: DC

## 2017-01-14 MED ORDER — SODIUM CHLORIDE 0.9 % IV SOLN
INTRAVENOUS | Status: AC | PRN
  Administered 2017-01-14: 10 mL/h via INTRAVENOUS

## 2017-01-14 MED ORDER — LIDOCAINE HCL (PF) 1 % IJ SOLN
INTRAMUSCULAR | Status: AC
  Filled 2017-01-14: qty 10

## 2017-01-14 NOTE — H&P (Signed)
Chief Complaint: Patient was seen in consultation today for random liver biopsy at the request of Campbell,John  Referring Physician(s): Campbell,John  Supervising Physician: Jolaine ClickHoss, Arthur  [facility]  History of Present Illness: Brandi Cervantes is a 21 y.o. female   Dx Hepatitis B almost 5 yrs ago follwed now with Dr Orvan Falconerampbell Elastography fibrosis score has increased 2015 (F0/F1) to  (F2/F3) 2018 Scheduled now for random liver core biopsy   Past Medical History:  Diagnosis Date  . Hepatitis B    diagnosed in pregnancy    Past Surgical History:  Procedure Laterality Date  . CESAREAN SECTION      Allergies: Patient has no known allergies.  Medications: Prior to Admission medications   Not on File     History reviewed. No pertinent family history.  Social History   Social History  . Marital status: Married    Spouse name: N/A  . Number of children: N/A  . Years of education: N/A   Social History Main Topics  . Smoking status: Never Smoker  . Smokeless tobacco: Never Used  . Alcohol use No  . Drug use: No  . Sexual activity: Not Currently    Birth control/ protection: None, IUD   Other Topics Concern  . None   Social History Narrative  . None    Review of Systems: A 12 point ROS discussed and pertinent positives are indicated in the HPI above.  All other systems are negative.  Review of Systems  Constitutional: Negative for activity change, appetite change, fatigue and fever.  Respiratory: Negative for cough and shortness of breath.   Gastrointestinal: Negative for abdominal pain.  Musculoskeletal: Negative for back pain and gait problem.  Neurological: Negative for weakness.  Psychiatric/Behavioral: Negative for behavioral problems and confusion.    Vital Signs: BP (!) 87/68 (BP Location: Right Arm)   Pulse 71   Temp 97.8 F (36.6 C) (Oral)   Ht 5\' 1"  (1.549 m)   Wt 169 lb 12.1 oz (77 kg)   SpO2 100%   BMI 32.07 kg/m     Physical Exam  Constitutional: She is oriented to person, place, and time. She appears well-nourished.  Cardiovascular: Normal rate, regular rhythm and normal heart sounds.   Pulmonary/Chest: Effort normal and breath sounds normal.  Abdominal: Soft. Bowel sounds are normal.  Musculoskeletal: Normal range of motion.  Neurological: She is alert and oriented to person, place, and time.  Skin: Skin is warm and dry.  Psychiatric: She has a normal mood and affect. Her behavior is normal. Judgment and thought content normal.  Nursing note and vitals reviewed.  Speaks and understands English well Husband at bedside speaks fluent English  Mallampati Score:  MD Evaluation Airway: WNL Heart: WNL Abdomen: WNL Chest/ Lungs: WNL ASA  Classification: 2 Mallampati/Airway Score: One  Imaging: No results found.  Labs:  CBC:  Recent Labs  10/23/16 1057 01/14/17 1141  WBC 5.0 5.5  HGB 13.3 12.5  HCT 40.4 37.6  PLT 267 170    COAGS: No results for input(s): INR, APTT in the last 8760 hours.  BMP:  Recent Labs  04/02/16 1543 10/01/16 1606  NA 140 139  K 4.1 4.1  CL 104 106  CO2 22 26  GLUCOSE 92 97  BUN 16 16  CALCIUM 9.7 8.9  CREATININE 0.79 0.77    LIVER FUNCTION TESTS:  Recent Labs  04/02/16 1543 10/01/16 1606 10/23/16 1057  BILITOT 0.3 0.4  --  AST 19 19 23   ALT 14 20 35*  ALKPHOS 91 78  --   PROT 6.9 6.7  --   ALBUMIN 4.0 4.0  --     TUMOR MARKERS: No results for input(s): AFPTM, CEA, CA199, CHROMGRNA in the last 8760 hours.  Assessment and Plan:  Hepatitis B Increase in fibrosis score of elastography Now for random liver biopsy Risks and Benefits discussed with the patient including, but not limited to bleeding, infection, damage to adjacent structures or low yield requiring additional tests. All of the patient's questions were answered, patient is agreeable to proceed. Consent signed and in chart.   Thank you for this interesting consult.   I greatly enjoyed meeting Brandi Cervantes and look forward to participating in their care.  A copy of this report was sent to the requesting provider on this date.  Electronically Signed: Robet Leu, PA-C 01/14/2017, 12:27 PM   I spent a total of  30 Minutes   in face to face in clinical consultation, greater than 50% of which was counseling/coordinating care for random liver biopsy

## 2017-01-14 NOTE — Discharge Instructions (Signed)
Liver Biopsy, Care After °These instructions give you information on caring for yourself after your procedure. Your doctor may also give you more specific instructions. Call your doctor if you have any problems or questions after your procedure. °Follow these instructions at home: °· Rest at home for 1-2 days or as told by your doctor. °· Have someone stay with you for at least 24 hours. °· Do not do these things in the first 24 hours: °? Drive. °? Use machinery. °? Take care of other people. °? Sign legal documents. °? Take a bath or shower. °· There are many different ways to close and cover a cut (incision). For example, a cut can be closed with stitches, skin glue, or adhesive strips. Follow your doctor's instructions on: °? Taking care of your cut. °? Changing and removing your bandage (dressing). °? Removing whatever was used to close your cut. °· Do not drink alcohol in the first week. °· Do not lift more than 5 pounds or play contact sports for the first 2 weeks. °· Take medicines only as told by your doctor. For 1 week, do not take medicine that has aspirin in it or medicines like ibuprofen. °· Get your test results. °Contact a doctor if: °· A cut bleeds and leaves more than just a small spot of blood. °· A cut is red, puffs up (swells), or hurts more than before. °· Fluid or something else comes from a cut. °· A cut smells bad. °· You have a fever or chills. °Get help right away if: °· You have swelling, bloating, or pain in your belly (abdomen). °· You get dizzy or faint. °· You have a rash. °· You feel sick to your stomach (nauseous) or throw up (vomit). °· You have trouble breathing, feel short of breath, or feel faint. °· Your chest hurts. °· You have problems talking or seeing. °· You have trouble balancing or moving your arms or legs. °This information is not intended to replace advice given to you by your health care provider. Make sure you discuss any questions you have with your health care  provider. °Document Released: 03/24/2008 Document Revised: 11/21/2015 Document Reviewed: 08/11/2013 °Elsevier Interactive Patient Education © 2018 [employer] ° °

## 2017-01-14 NOTE — Progress Notes (Signed)
Referring Physician(s): Campbell,John  Supervising Physician: Jolaine ClickHoss, Arthur   Chief Complaint:  Hep B  Subjective:  Liver biopsy done in IR today. Now pt complaining of RUQ pain Denies N/V Vitals stable per RN Oxy IR 5 mg po ordered for pain CBC stat  Allergies: Patient has no known allergies.  Medications: Prior to Admission medications   Not on File     Vital Signs: BP 107/70   Pulse 81   Temp 98.7 F (37.1 C) (Oral)   Resp 15   Ht 5\' 1"  (1.549 m)   Wt 169 lb 12.1 oz (77 kg)   SpO2 98%   BMI 32.07 kg/m   Physical Exam  Constitutional: She is oriented to person, place, and time.  Pulmonary/Chest: Effort normal and breath sounds normal.  Abdominal: Soft. Bowel sounds are normal. There is tenderness.  Mildly tender RUQ Can palpate deeply with minimal pain No masses   Musculoskeletal: Normal range of motion.  Neurological: She is alert and oriented to person, place, and time.  Skin: Skin is warm and dry.  Psychiatric: She has a normal mood and affect. Her behavior is normal.  Nursing note and vitals reviewed.   Imaging: [place] Biopsy  Result Date: 01/14/2017 INDICATION: Liver fibrosis.  Hepatitis-B. EXAM: ULTRASOUND-GUIDED RANDOM LIVER CORE BIOPSY MEDICATIONS: None. ANESTHESIA/SEDATION: Fentanyl 100 mcg IV; Versed 2 mg IV Moderate Sedation Time:  10 The patient was continuously monitored during the procedure by the interventional radiology nurse under my direct supervision. FLUOROSCOPY TIME:  None COMPLICATIONS: None immediate. PROCEDURE: Informed written consent was obtained from the patient after a thorough discussion of the procedural risks, benefits and alternatives. All questions were addressed. Maximal Sterile Barrier Technique was utilized including caps, mask, sterile gowns, sterile gloves, sterile drape, hand hygiene and skin antiseptic. A timeout was performed prior to the initiation of the procedure. The right flank was prepped with ChloraPrep in a  sterile fashion, and a sterile drape was applied covering the operative field. A sterile gown and sterile gloves were used for the procedure. Under sonographic guidance, an 17 gauge guide needle was advanced into the right lobe of the liver. Subsequently 3 18 gauge core biopsies were obtained. Gel-Foam slurry was injected into the needle tract. The guide needle was removed. Final imaging was performed. Patient tolerated the procedure well without complication. Vital sign monitoring by nursing staff during the procedure will continue as patient is in the special procedures unit for post procedure observation. FINDINGS: The images document guide needle placement within the right lobe of the liver. Post biopsy images demonstrate no hemorrhage. IMPRESSION: Successful ultrasound-guided core biopsy in the right lobe of the liver. Electronically Signed   By: Jolaine ClickArthur  Hoss M.D.   On: 01/14/2017 14:44    Labs:  CBC:  Recent Labs  10/23/16 1057 01/14/17 1141  WBC 5.0 5.5  HGB 13.3 12.5  HCT 40.4 37.6  PLT 267 170    COAGS:  Recent Labs  01/14/17 1141  INR 1.09  APTT 29    BMP:  Recent Labs  04/02/16 1543 10/01/16 1606  NA 140 139  K 4.1 4.1  CL 104 106  CO2 22 26  GLUCOSE 92 97  BUN 16 16  CALCIUM 9.7 8.9  CREATININE 0.79 0.77    LIVER FUNCTION TESTS:  Recent Labs  04/02/16 1543 10/01/16 1606 10/23/16 1057  BILITOT 0.3 0.4  --   AST 19 19 23   ALT 14 20 35*  ALKPHOS 91 78  --  PROT 6.9 6.7  --   ALBUMIN 4.0 4.0  --     Assessment and Plan:  RUQ pain post liver bx Minimal pain; abd soft Checking stat cbc   Electronically Signed: Bebe Moncure A, PA-C 01/14/2017, 2:54 PM   I spent a total of 15 Minutes at the the patient's bedside AND on the patient's hospital floor or unit, greater than 50% of which was counseling/coordinating care for pain post liver bx

## 2017-01-14 NOTE — Procedures (Signed)
Random liver core Bx 18 g times three EBL 0 Comp 0 

## 2017-01-18 ENCOUNTER — Encounter: Payer: Self-pay | Admitting: Internal Medicine

## 2017-01-18 ENCOUNTER — Ambulatory Visit (INDEPENDENT_AMBULATORY_CARE_PROVIDER_SITE_OTHER): Payer: Medicaid Other | Admitting: Internal Medicine

## 2017-01-18 DIAGNOSIS — B181 Chronic viral hepatitis B without delta-agent: Secondary | ICD-10-CM | POA: Diagnosis not present

## 2017-01-18 NOTE — Assessment & Plan Note (Signed)
There was no fibrosis found on liver biopsy indicating that the recent ultrasound was elastography overestimated her potential fibrosis. I will continue observation off of antiviral therapy. She will follow-up after lab work in 6 months.

## 2017-01-18 NOTE — Progress Notes (Signed)
[facility]  Patient Active Problem List   Diagnosis Date Noted  . Chronic hepatitis B (HCC) 09/10/2012    Priority: High  . Yeast vaginitis 03/22/2014  . Lumbar strain 10/20/2013  . GERD (gastroesophageal reflux disease) 09/26/2012  . Anemia 09/26/2012  . Language barrier, cultural differences 09/10/2012  . Supervision of other high-risk pregnancy(V23.89) 09/10/2012  . Previous cesarean delivery affecting pregnancy, antepartum 09/07/2012    Patient's Medications   No medications on file    Subjective: Brandi Cervantes is in with her husband for her routine follow-up visit. She underwent liver biopsy last week and is in to review those results.  Review of Systems: Review of Systems  Constitutional: Negative for chills, diaphoresis, fever and weight loss.  Gastrointestinal: Positive for abdominal pain. Negative for constipation, diarrhea, heartburn, nausea and vomiting.       She had some right upper quadrant abdominal pain for about 24 hours after her biopsy.    Past Medical History:  Diagnosis Date  . Hepatitis B    diagnosed in pregnancy    Social History  Substance Use Topics  . Smoking status: Never Smoker  . Smokeless tobacco: Never Used  . Alcohol use No    No family history on file.  No Known Allergies  Objective: Vitals:   01/18/17 1516  BP: 109/70  Pulse: 86  Temp: 98.3 F (36.8 C)  SpO2: 98%  Weight: 164 lb 14.5 oz (74.8 kg)   Body mass index is 31.16 kg/m.  Physical Exam  Constitutional: She is oriented to person, place, and time.  She is smiling and in good spirits as usual.  Abdominal: Soft. She exhibits no distension and no mass. There is no tenderness.  Neurological: She is alert and oriented to person, place, and time.  Skin: No rash noted.  Psychiatric: Mood and affect normal.    Lab Results CMP     Component Value Date/Time   NA 139 10/01/2016 1606   K 4.1 10/01/2016 1606   CL 106 10/01/2016 1606     CO2 26 10/01/2016 1606   GLUCOSE 97 10/01/2016 1606   BUN 16 10/01/2016 1606   CREATININE 0.77 10/01/2016 1606   CALCIUM 8.9 10/01/2016 1606   PROT 6.7 10/01/2016 1606   ALBUMIN 4.0 10/01/2016 1606   AST 23 10/23/2016 1057   ALT 35 (H) 10/23/2016 1057   ALKPHOS 78 10/01/2016 1606   BILITOT 0.4 10/01/2016 1606   GFRNONAA >89 03/06/2014 1407   GFRAA >89 03/06/2014 1407  Hepatitis B e antigen 10/01/2016: Negative Hepatitis B e antibody 10/01/2016: Positive Hepatitis bDNA viral load 10/01/2016 50,500 IU/ml  EXAM: ULTRASOUND ABDOMEN 12/10/2016  ULTRASOUND HEPATIC ELASTOGRAPHY  TECHNIQUE: Sonography of the upper abdomen was performed. In addition, ultrasound elastography evaluation of the liver was performed. A region of interest was placed within the right lobe of the liver. Following application of a compressive sonographic pulse, shear waves were detected in the adjacent hepatic tissue and the shear wave velocity was calculated. Multiple assessments were performed at the selected site. Median shear wave velocity is correlated to a Metavir fibrosis score.  COMPARISON:  None.  FINDINGS: ULTRASOUND ABDOMEN  Gallbladder: No gallstones or wall thickening visualized. No sonographic Murphy sign noted by sonographer.  Common bile duct: Diameter: Normal, 3 mm.  Liver: No focal lesion identified. Within normal limits in parenchymal echogenicity.  IVC: No abnormality visualized.  Pancreas: Obscured by overlying bowel gas.  Spleen: Size and appearance  within normal limits.  Right Kidney: Length: 11.0 cm. Echogenicity within normal limits. No mass or hydronephrosis visualized.  Left Kidney: Length: 10.6 cm. Echogenicity within normal limits. No mass or hydronephrosis visualized.  Abdominal aorta: No aneurysm visualized.  Other findings: None.  ULTRASOUND HEPATIC ELASTOGRAPHY  Device: Siemens Helix VTQ  Patient position: Left Lateral  Decubitus  Transducer 6C1  Number of measurements: 10  Hepatic segment:  8  Median velocity:   1.21  m/sec  IQR: 0.15  IQR/Median velocity ratio: 0.12  Corresponding Metavir fibrosis score:  F2 + some F3  Risk of fibrosis: Moderate  Limitations of exam: None  Pertinent findings noted on other imaging exams:  None  Please note that abnormal shear wave velocities may also be identified in clinical settings other than with hepatic fibrosis, such as: acute hepatitis, elevated right heart and central venous pressures including use of beta blockers, veno-occlusive disease (Budd-Chiari), infiltrative processes such as mastocytosis/amyloidosis/infiltrative tumor, extrahepatic cholestasis, in the post-prandial state, and liver transplantation. Correlation with patient history, laboratory data, and clinical condition recommended.  IMPRESSION: ULTRASOUND ABDOMEN: Normal abdominal ultrasound.  ULTRASOUND HEPATIC ELASTOGRAPHY:  Median hepatic shear wave velocity is calculated at 1.21 m/sec.  Corresponding Metavir fibrosis score is  F2 + some F3.  Risk of fibrosis is Moderate.  Follow-up: Additional testing appropriate  Electronically Signed   By: Jeronimo Greaves M.D.   On: 12/10/2016 10:03  Liver Biopsy 01/14/2017  - MILD PORTAL HEPATITIS. - NO SIGNIFICANT FIBROSIS.   Microscopic Comment The patient's stated history of Hepatitis B is noted. The biopsy fragments reveal mild portal hepatitis in the form of lymphocytes and plasma cells. There is mild lobular extension. No active hepatitis nor hepatocyte dropout is identified. No significant fibrosis is identified, based on the trichrome stain. PAS, iron, and reticulin stains are essentially normal. Overall, the hepatitis appears grade 1, stage 0. (JBK:ecj 01/15/2017)  Pecola Leisure MD Pathologist, Electronic Signature (Case signed 01/15/2017)   Problem List Items Addressed This Visit      High   Chronic  hepatitis B (HCC)    There was no fibrosis found on liver biopsy indicating that the recent ultrasound was elastography overestimated her potential fibrosis. I will continue observation off of antiviral therapy. She will follow-up after lab work in 6 months.      Relevant Orders   Hepatitis B DNA, ultraquantitative, PCR   Hepatitis B e antibody   Hepatitis B e antigen   Comprehensive metabolic panel       Cliffton Asters, MD [facility] [phone] pager   [phone] cell 01/18/2017, 3:40 PM

## 2017-01-22 ENCOUNTER — Encounter (HOSPITAL_COMMUNITY): Payer: Self-pay | Admitting: *Deleted

## 2017-01-22 ENCOUNTER — Emergency Department (HOSPITAL_COMMUNITY): Payer: Medicaid Other

## 2017-01-22 ENCOUNTER — Emergency Department (HOSPITAL_COMMUNITY)
Admission: EM | Admit: 2017-01-22 | Discharge: 2017-01-22 | Disposition: A | Discharge status: Home / Self Care | Payer: Medicaid Other | Attending: Emergency Medicine | Admitting: Emergency Medicine

## 2017-01-22 DIAGNOSIS — R1011 Right upper quadrant pain: Secondary | ICD-10-CM | POA: Insufficient documentation

## 2017-01-22 DIAGNOSIS — R109 Unspecified abdominal pain: Secondary | ICD-10-CM | POA: Diagnosis present

## 2017-01-22 DIAGNOSIS — R9431 Abnormal electrocardiogram [ECG] [EKG]: Secondary | ICD-10-CM | POA: Diagnosis not present

## 2017-01-22 LAB — CBC WITH DIFFERENTIAL/PLATELET
Basophils Absolute: 0 10*3/uL (ref 0.0–0.1)
Basophils Relative: 0 %
EOS ABS: 0.2 10*3/uL (ref 0.0–0.7)
EOS PCT: 3 %
HCT: 39.3 % (ref 36.0–46.0)
Hemoglobin: 13.1 g/dL (ref 12.0–15.0)
LYMPHS ABS: 2.3 10*3/uL (ref 0.7–4.0)
LYMPHS PCT: 31 %
MCH: 28.4 pg (ref 26.0–34.0)
MCHC: 33.3 g/dL (ref 30.0–36.0)
MCV: 85.1 fL (ref 78.0–100.0)
MONOS PCT: 7 %
Monocytes Absolute: 0.5 10*3/uL (ref 0.1–1.0)
Neutro Abs: 4.3 10*3/uL (ref 1.7–7.7)
Neutrophils Relative %: 59 %
PLATELETS: 223 10*3/uL (ref 150–400)
RBC: 4.62 MIL/uL (ref 3.87–5.11)
RDW: 13.2 % (ref 11.5–15.5)
WBC: 7.3 10*3/uL (ref 4.0–10.5)

## 2017-01-22 LAB — BASIC METABOLIC PANEL
Anion gap: 8 (ref 5–15)
BUN: 8 mg/dL (ref 6–20)
CHLORIDE: 107 mmol/L (ref 101–111)
CO2: 24 mmol/L (ref 22–32)
CREATININE: 0.6 mg/dL (ref 0.44–1.00)
Calcium: 9.2 mg/dL (ref 8.9–10.3)
GFR calc Af Amer: >60 mL/min (ref >60)
GLUCOSE: 81 mg/dL (ref 65–99)
POTASSIUM: 3.9 mmol/L (ref 3.5–5.1)
Sodium: 139 mmol/L (ref 135–145)

## 2017-01-22 MED ORDER — MELOXICAM 7.5 MG PO TABS: 15.0000 mg | ORAL_TABLET | Freq: Every day | ORAL | 0 refills | Status: DC

## 2017-01-22 MED ORDER — OXYCODONE HCL 5 MG PO TABS
10.0000 mg | ORAL_TABLET | ORAL | Status: AC
  Administered 2017-01-22: 10 mg via ORAL
  Filled 2017-01-22: qty 2

## 2017-01-22 MED ORDER — SODIUM CHLORIDE 0.9 % IV BOLUS (SEPSIS)
1000.0000 mL | Freq: Once | INTRAVENOUS | Status: AC
  Administered 2017-01-22: 1000 mL via INTRAVENOUS

## 2017-01-22 NOTE — ED Provider Notes (Signed)
[facility]-EMERGENCY DEPT Provider Note   CSN: [id number] Arrival date & time: 01/22/17  1250     History   Chief Complaint Chief Complaint  Patient presents with  . Abdominal Pain    HPI Brandi Cervantes is a 21 y.o. female.  HPI  21 year old female, has hx of Hep B chronic - had biopsy of the liver 8 days ago and has had intermittent pain in that area since that time. The husband states that she has had some good days and some bad days, yesterday was particularly good but today she had increased pain in the right upper quadrant. She has also noticed some increased pain with deep breathing and coughing. There has been some cough and some sputum production but no fevers. The pain is primarily in the right upper quadrant, there is no pain on the left side or the bottom of the abdomen, no vomiting, no diarrhea, no rashes.  According to the medical record the patient's biopsy showed that she did have pathology concerning for hepatitis B but the fibrosis was not as bad as originally thought on ultrasound. The husband reports that she has had this hepatitis B for approximate 4-1/2 years.  Past Medical History:  Diagnosis Date  . Hepatitis B    diagnosed in pregnancy    Patient Active Problem List   Diagnosis Date Noted  . Yeast vaginitis 03/22/2014  . Lumbar strain 10/20/2013  . GERD (gastroesophageal reflux disease) 09/26/2012  . Anemia 09/26/2012  . Language barrier, cultural differences 09/10/2012  . Supervision of other high-risk pregnancy(V23.89) 09/10/2012  . Chronic hepatitis B (HCC) 09/10/2012  . Previous cesarean delivery affecting pregnancy, antepartum 09/07/2012    Past Surgical History:  Procedure Laterality Date  . CESAREAN SECTION      OB History    Gravida Para Term Preterm AB Living   2 2 2  0 0 2   SAB TAB Ectopic Multiple Live Births   0 0 0 0 2       Home Medications    Prior to Admission medications   Medication Sig Start Date End Date Taking? Authorizing  Provider  meloxicam (MOBIC) 7.5 MG tablet Take 2 tablets (15 mg total) by mouth daily. 01/22/17   Eber HongMiller, Jevan Gaunt, MD    Family History No family history on file.  Social History Social History  Substance Use Topics  . Smoking status: Never Smoker  . Smokeless tobacco: Never Used  . Alcohol use No     Allergies   Patient has no known allergies.   Review of Systems Review of Systems  All other systems reviewed and are negative.    Physical Exam Updated Vital Signs BP 90/60   Pulse 86   Temp 98.1 F (36.7 C) (Oral)   Resp 16   SpO2 99%   Physical Exam  Constitutional: She appears well-developed and well-nourished. No distress.  HENT:  Head: Normocephalic and atraumatic.  Mouth/Throat: Oropharynx is clear and moist. No oropharyngeal exudate.  Eyes: Pupils are equal, round, and reactive to light. Conjunctivae and EOM are normal. Right eye exhibits no discharge. Left eye exhibits no discharge. No scleral icterus.  Neck: Normal range of motion. Neck supple. No JVD present. No thyromegaly present.  Cardiovascular: Normal rate, regular rhythm, normal heart sounds and intact distal pulses.  Exam reveals no gallop and no friction rub.   No murmur heard. Pulmonary/Chest: Effort normal and breath sounds normal. No respiratory distress. She has no wheezes. She has no rales. She exhibits tenderness (  ttp over the R chest wall - chaperone present - normal appaering breast tissue on right).  Abdominal: Soft. Bowel sounds are normal. She exhibits no distension and no mass. There is tenderness ( minimal RUQ ttp - no guarding, no other abd ttp, no RLQ ttp, ).  Musculoskeletal: Normal range of motion. She exhibits no edema or tenderness.  Lymphadenopathy:    She has no cervical adenopathy.  Neurological: She is alert. Coordination normal.  Skin: Skin is warm and dry. No rash ( small biopsy site but no redness.) noted. No erythema.  Psychiatric: She has a normal mood and affect. Her  behavior is normal.  Nursing note and vitals reviewed.    ED Treatments / Results  Labs (all labs ordered are listed, but only abnormal results are displayed) Labs Reviewed  CBC WITH DIFFERENTIAL/PLATELET  BASIC METABOLIC PANEL     Radiology Dg Chest 2 View  Result Date: 01/22/2017 CLINICAL DATA:  Liver biopsy yesterday.  Pain with inspiration. EXAM: CHEST  2 VIEW COMPARISON:  None FINDINGS: Extensive artifact overlies the chest. No evidence of pneumothorax or hemothorax. Even with the extensive overlying artifact, I have low suspicion that we might be missing any finding. Heart size is normal. Lungs are clear. IMPRESSION: Extensive overlying artifact. No complication of the liver biopsy seen. Electronically Signed   By: Paulina FusiMark  Shogry M.D.   On: 01/22/2017 15:12   [place] Abdomen Limited Ruq  Result Date: 01/22/2017 CLINICAL DATA:  Abdominal pain. EXAM: ULTRASOUND ABDOMEN LIMITED RIGHT UPPER QUADRANT COMPARISON:  None. FINDINGS: Gallbladder: No gallstones or wall thickening visualized. No sonographic Murphy sign noted by sonographer. Common bile duct: Diameter: 3.8 mm Liver: No focal lesion identified. Within normal limits in parenchymal echogenicity. IMPRESSION: No acute or focal abnormality. Electronically Signed   By: Maisie Fushomas  Register   On: 01/22/2017 15:06    Procedures Procedures (including critical care time)  Medications Ordered in ED Medications  oxyCODONE (Oxy IR/ROXICODONE) immediate release tablet 10 mg (10 mg Oral Given 01/22/17 1435)  sodium chloride 0.9 % bolus 1,000 mL (1,000 mLs Intravenous New Bag/Given 01/22/17 1440)     Initial Impression / Assessment and Plan / ED Course  I have reviewed the triage vital signs and the nursing notes.  Pertinent labs & imaging results that were available during my care of the patient were reviewed by me and considered in my medical decision making (see chart for details).     We will evaluate for possible lung pathology given the  pain with deep breathing as well as right upper quadrant pathology such as hematoma or postbiopsy abscess though this would seem unlikely given no fevers or tachycardia.  US and CXR neg Fluids given Pain improved Pt feels much better No signs of infection or bleeding Pt and spouse given results Stable for d;c Pt and family agreeable to plan and return precautions.  The blood pressure is slightly low, blood pressure is 90/60, no fever, no tachycardia.  Final Clinical Impressions(s) / ED Diagnoses   Final diagnoses:  Abdominal pain  Right upper quadrant abdominal pain    New Prescriptions New Prescriptions   MELOXICAM (MOBIC) 7.5 MG TABLET    Take 2 tablets (15 mg total) by mouth daily.     Eber HongMiller, Maniah Nading, MD 01/22/17 [phone]

## 2017-01-22 NOTE — ED Notes (Signed)
Patient transported to US/X-ray

## 2017-01-22 NOTE — ED Triage Notes (Signed)
Pts family reports liver biopsy on 01/14/17, pts spouse reports pt had intermittent severe pain yesterday, pt denies n/v/d, pt reports pain worse with deep inspiration, pt A&O x4

## 2017-01-22 NOTE — Discharge Instructions (Signed)
Your ultrasound and xrays are normal Your blood work was normal There does not appear to be any bleeding or infection around the liver Return to the ER for worsening pain, fever or vomiting

## 2017-07-07 ENCOUNTER — Other Ambulatory Visit: Payer: Medicaid Other

## 2017-07-07 DIAGNOSIS — B181 Chronic viral hepatitis B without delta-agent: Secondary | ICD-10-CM

## 2017-07-08 LAB — COMPREHENSIVE METABOLIC PANEL
AG Ratio: 1.4 (calc) (ref 1.0–2.5)
ALKALINE PHOSPHATASE (APISO): 83 U/L (ref 33–115)
ALT: 42 U/L — AB (ref 6–29)
AST: 33 U/L — AB (ref 10–30)
Albumin: 4.3 g/dL (ref 3.6–5.1)
BILIRUBIN TOTAL: 0.5 mg/dL (ref 0.2–1.2)
BUN: 11 mg/dL (ref 7–25)
CO2: 26 mmol/L (ref 20–32)
Calcium: 9.6 mg/dL (ref 8.6–10.2)
Chloride: 104 mmol/L (ref 98–110)
Creat: 0.67 mg/dL (ref 0.50–1.10)
Globulin: 3 g/dL (calc) (ref 1.9–3.7)
Glucose, Bld: 88 mg/dL (ref 65–99)
Potassium: 4.2 mmol/L (ref 3.5–5.3)
SODIUM: 139 mmol/L (ref 135–146)
TOTAL PROTEIN: 7.3 g/dL (ref 6.1–8.1)

## 2017-07-08 LAB — HEPATITIS B E ANTIBODY: HEP B E AB: REACTIVE — AB

## 2017-07-08 LAB — HEPATITIS B E ANTIGEN: HEP B E AG: NONREACTIVE

## 2017-07-09 ENCOUNTER — Other Ambulatory Visit: Payer: Medicaid Other

## 2017-07-09 LAB — HEPATITIS B DNA, ULTRAQUANTITATIVE, PCR
HEPATITIS B DNA (CALC): 4.69 {Log_IU}/mL — AB
HEPATITIS B DNA: 48600 [IU]/mL — AB

## 2017-07-20 ENCOUNTER — Ambulatory Visit: Payer: Self-pay | Admitting: Internal Medicine

## 2017-07-21 ENCOUNTER — Ambulatory Visit: Payer: Medicaid Other | Admitting: Internal Medicine

## 2017-08-30 ENCOUNTER — Encounter: Payer: Self-pay | Admitting: Internal Medicine

## 2017-08-30 ENCOUNTER — Ambulatory Visit (INDEPENDENT_AMBULATORY_CARE_PROVIDER_SITE_OTHER): Payer: Medicaid Other | Admitting: Internal Medicine

## 2017-08-30 DIAGNOSIS — Z23 Encounter for immunization: Secondary | ICD-10-CM | POA: Diagnosis not present

## 2017-08-30 DIAGNOSIS — B181 Chronic viral hepatitis B without delta-agent: Secondary | ICD-10-CM | POA: Diagnosis present

## 2017-08-30 NOTE — Assessment & Plan Note (Signed)
Her liver enzymes have started to trend upward but are still quite low.  I told her that she may benefit from starting therapy if they continue to go up.  She will follow-up after lab work in 6 months

## 2017-08-30 NOTE — Progress Notes (Signed)
         [facility]  Patient Active Problem List   Diagnosis Date Noted  . Chronic hepatitis B (HCC) 09/10/2012    Priority: High  . Yeast vaginitis 03/22/2014  . Lumbar strain 10/20/2013  . GERD (gastroesophageal reflux disease) 09/26/2012  . Anemia 09/26/2012  . Language barrier, cultural differences 09/10/2012  . Supervision of other high-risk pregnancy(V23.89) 09/10/2012  . Previous cesarean delivery affecting pregnancy, antepartum 09/07/2012    Patient's Medications  New Prescriptions   No medications on file  Previous Medications   MELOXICAM (MOBIC) 7.5 MG TABLET    Take 2 tablets (15 mg total) by mouth daily.  Modified Medications   No medications on file  Discontinued Medications   No medications on file    Subjective: Lovenia with her family for her routine follow-up visit.  She is feeling well and has had no health concerns since her last visit.  Review of Systems: Review of Systems  Constitutional: Negative for chills, diaphoresis, fever and weight loss.  Gastrointestinal: Negative for abdominal pain, diarrhea, nausea and vomiting.    Past Medical History:  Diagnosis Date  . Hepatitis B    diagnosed in pregnancy    Social History   Tobacco Use  . Smoking status: Never Smoker  . Smokeless tobacco: Never Used  Substance Use Topics  . Alcohol use: No  . Drug use: No    No family history on file.  No Known Allergies  Objective: Vitals:   08/30/17 1504  BP: 109/72  Pulse: 76  Temp: 97.6 F (36.4 C)  TempSrc: Oral  Weight: 167 lb 8.8 oz (76 kg)  Height: 5\' 1"  (1.549 m)   Body mass index is 31.66 kg/m.  Physical Exam  Constitutional: She is oriented to person, place, and time. No distress.  Cardiovascular: Normal rate and regular rhythm.  No murmur heard. Pulmonary/Chest: Effort normal. She has no wheezes. She has no rales.  Abdominal: Soft. She exhibits no distension. There is no tenderness.  Neurological:  She is alert and oriented to person, place, and time.  Psychiatric: Mood and affect normal.    Lab Results 07/07/2017 AST 33 ALT 42 Hepatitis B viral load 48,600 Hepatitis B E antigen negative Liver biopsy in July 2018 showed mild portal hepatitis but no fibrosis   Problem List Items Addressed This Visit      High   Chronic hepatitis B (HCC)    Her liver enzymes have started to trend upward but are still quite low.  I told her that she may benefit from starting therapy if they continue to go up.  She will follow-up after lab work in 6 months      Relevant Orders   Hepatitis B DNA, ultraquantitative, PCR   Hepatitis B e antibody   Hepatitis B e antigen   Comprehensive metabolic panel   CBC       Cliffton AstersJohn Pasha Broad, MD [facility] [phone] pager   [phone] cell 08/30/2017, 3:27 PM

## 2017-12-20 DIAGNOSIS — Z309 Encounter for contraceptive management, unspecified: Secondary | ICD-10-CM | POA: Diagnosis not present

## 2017-12-20 DIAGNOSIS — B169 Acute hepatitis B without delta-agent and without hepatic coma: Secondary | ICD-10-CM | POA: Diagnosis not present

## 2017-12-20 DIAGNOSIS — Z124 Encounter for screening for malignant neoplasm of cervix: Secondary | ICD-10-CM | POA: Diagnosis not present

## 2017-12-20 DIAGNOSIS — Z13220 Encounter for screening for lipoid disorders: Secondary | ICD-10-CM | POA: Diagnosis not present

## 2018-01-24 ENCOUNTER — Other Ambulatory Visit: Payer: Medicaid Other

## 2018-01-24 DIAGNOSIS — B181 Chronic viral hepatitis B without delta-agent: Secondary | ICD-10-CM

## 2018-01-25 LAB — COMPREHENSIVE METABOLIC PANEL
AG RATIO: 1.5 (calc) (ref 1.0–2.5)
ALKALINE PHOSPHATASE (APISO): 78 U/L (ref 33–115)
ALT: 57 U/L — ABNORMAL HIGH (ref 6–29)
AST: 41 U/L — ABNORMAL HIGH (ref 10–30)
Albumin: 4 g/dL (ref 3.6–5.1)
BUN: 14 mg/dL (ref 7–25)
CALCIUM: 9.3 mg/dL (ref 8.6–10.2)
CHLORIDE: 105 mmol/L (ref 98–110)
CO2: 30 mmol/L (ref 20–32)
Creat: 0.7 mg/dL (ref 0.50–1.10)
GLOBULIN: 2.7 g/dL (ref 1.9–3.7)
Glucose, Bld: 99 mg/dL (ref 65–99)
POTASSIUM: 3.9 mmol/L (ref 3.5–5.3)
SODIUM: 139 mmol/L (ref 135–146)
Total Bilirubin: 0.4 mg/dL (ref 0.2–1.2)
Total Protein: 6.7 g/dL (ref 6.1–8.1)

## 2018-01-25 LAB — HEPATITIS B E ANTIBODY: Hep B E Ab: REACTIVE — AB

## 2018-01-25 LAB — CBC
HCT: 40.4 % (ref 35.0–45.0)
HEMOGLOBIN: 13.4 g/dL (ref 11.7–15.5)
MCH: 29.3 pg (ref 27.0–33.0)
MCHC: 33.2 g/dL (ref 32.0–36.0)
MCV: 88.2 fL (ref 80.0–100.0)
MPV: 11.9 fL (ref 7.5–12.5)
Platelets: 205 10*3/uL (ref 140–400)
RBC: 4.58 10*6/uL (ref 3.80–5.10)
RDW: 12.6 % (ref 11.0–15.0)
WBC: 5.7 10*3/uL (ref 3.8–10.8)

## 2018-01-25 LAB — HEPATITIS B DNA, ULTRAQUANTITATIVE, PCR
HEPATITIS B DNA (CALC): 6 {Log_IU}/mL — AB
Hepatitis B DNA: 1010000 IU/mL — ABNORMAL HIGH

## 2018-01-25 LAB — HEPATITIS B E ANTIGEN: Hep B E Ag: NONREACTIVE

## 2018-02-08 ENCOUNTER — Ambulatory Visit (INDEPENDENT_AMBULATORY_CARE_PROVIDER_SITE_OTHER): Payer: Medicaid Other | Admitting: Internal Medicine

## 2018-02-08 ENCOUNTER — Encounter: Payer: Self-pay | Admitting: Internal Medicine

## 2018-02-08 DIAGNOSIS — B181 Chronic viral hepatitis B without delta-agent: Secondary | ICD-10-CM

## 2018-02-08 MED ORDER — TENOFOVIR ALAFENAMIDE FUMARATE 25 MG PO TABS: 25.0000 mg | ORAL_TABLET | Freq: Every day | ORAL | 11 refills | Status: DC

## 2018-02-08 NOTE — Progress Notes (Signed)
HPI: Brandi Cervantes is a 22 y.o. female who presents to the [facility] to see Dr. Orvan Falconerampbell for her chronic Hepatitis B infection.  Patient Active Problem List   Diagnosis Date Noted  . Yeast vaginitis 03/22/2014  . Lumbar strain 10/20/2013  . GERD (gastroesophageal reflux disease) 09/26/2012  . Anemia 09/26/2012  . Language barrier, cultural differences 09/10/2012  . Supervision of other high-risk pregnancy(V23.89) 09/10/2012  . Chronic hepatitis B (HCC) 09/10/2012  . Previous cesarean delivery affecting pregnancy, antepartum 09/07/2012    Patient's Medications  New Prescriptions   TENOFOVIR ALAFENAMIDE FUMARATE (VEMLIDY) 25 MG TABS    Take 1 tablet (25 mg total) by mouth daily.  Previous Medications   No medications on file  Modified Medications   No medications on file  Discontinued Medications   MELOXICAM (MOBIC) 7.5 MG TABLET    Take 2 tablets (15 mg total) by mouth daily.    Allergies: No Known Allergies  Past Medical History: Past Medical History:  Diagnosis Date  . Hepatitis B    diagnosed in pregnancy    Social History: Social History   Socioeconomic History  . Marital status: Married    Spouse name: Not on file  . Number of children: Not on file  . Years of education: Not on file  . Highest education level: Not on file  Occupational History  . Not on file  Social Needs  . Financial resource strain: Not on file  . Food insecurity:    Worry: Not on file    Inability: Not on file  . Transportation needs:    Medical: Not on file    Non-medical: Not on file  Tobacco Use  . Smoking status: Never Smoker  . Smokeless tobacco: Never Used  Substance and Sexual Activity  . Alcohol use: No  . Drug use: No  . Sexual activity: Not Currently    Birth control/protection: None, IUD  Lifestyle  . Physical activity:    Days per week: Not on file    Minutes per session: Not on file  . Stress: Not on file  Relationships  . Social connections:    Talks on phone: Not  on file    Gets together: Not on file    Attends religious service: Not on file    Active member of club or organization: Not on file    Attends meetings of clubs or organizations: Not on file    Relationship status: Not on file  Other Topics Concern  . Not on file  Social History Narrative  . Not on file    Labs: No results found for: HIV1RNAQUANT, HIV1RNAVL, CD4TABS  RPR and STI Lab Results  Component Value Date   LABRPR NON REACTIVE 11/09/2012   LABRPR NON REAC 09/07/2012    STI Results GC CT CT  11/09/2012 - Negative -  10/19/2012 NEGATIVE - NEGATIVE    Hepatitis B Lab Results  Component Value Date   HEPBSAB NONREACTIVE 09/07/2012   HEPBSAG POSITIVE (A) 09/07/2012   HEPBCAB POS (A) 09/07/2012   Hepatitis C No results found for: HEPCAB, HCVRNAPCRQN Hepatitis A No results found for: HAV Lipids: No results found for: CHOL, TRIG, HDL, CHOLHDL, VLDL, LDLCALC  Current Regimen: None  Assessment: Brandi Cervantes is here today to see Dr. Orvan Falconerampbell for her Hepatitis B follow-up.  She has not been on medication for Hepatitis B in the past but her Hep B DNA has progressed from 48,000 in January to over a million when checked on July. Her  LFTs have also doubled. She also had some F2/F3 on her abdominal ultrasound elastography back in June of 2018.  Dr. Or[facility]ampbell [facility] for a TAF containing regimen vs a TDF containing regimen.  [name] [place] [facility], and I will let them know one way or another and which medication we are going to start.  Plan: - PA for Velmidy - Viread if Vemlidy denied - I will f/u with patient and husband once approved  [name], PharmD, AAHIVP, CPP Infectious Diseases Clinical Pharmacist [facility] 02/08/2018, 11:55 AM

## 2018-02-08 NOTE — Assessment & Plan Note (Signed)
Her chronic hepatitis B is progressing.  Her ALT is now more than twice normal and her viral load is over 1 million.  I have recommended starting treatment now.  I would prefer to use Vemlidy if we can get approval.  She will follow-up in 4 to 6 weeks.

## 2018-02-08 NOTE — Progress Notes (Signed)
         [facility]  Patient Active Problem List   Diagnosis Date Noted  . Chronic hepatitis B (HCC) 09/10/2012    Priority: High  . Yeast vaginitis 03/22/2014  . Lumbar strain 10/20/2013  . GERD (gastroesophageal reflux disease) 09/26/2012  . Anemia 09/26/2012  . Language barrier, cultural differences 09/10/2012  . Supervision of other high-risk pregnancy(V23.89) 09/10/2012  . Previous cesarean delivery affecting pregnancy, antepartum 09/07/2012    Patient's Medications  New Prescriptions   TENOFOVIR ALAFENAMIDE FUMARATE (VEMLIDY) 25 MG TABS    Take 1 tablet (25 mg total) by mouth daily.  Previous Medications   No medications on file  Modified Medications   No medications on file  Discontinued Medications   MELOXICAM (MOBIC) 7.5 MG TABLET    Take 2 tablets (15 mg total) by mouth daily.    Subjective: Brandi Cervantes with her family for her routine follow-up visit.  She is feeling well and has had no health concerns since her last visit.  Review of Systems: Review of Systems  Constitutional: Negative for chills, diaphoresis, fever and weight loss.  Gastrointestinal: Negative for abdominal pain, diarrhea, nausea and vomiting.    Past Medical History:  Diagnosis Date  . Hepatitis B    diagnosed in pregnancy    Social History   Tobacco Use  . Smoking status: Never Smoker  . Smokeless tobacco: Never Used  Substance Use Topics  . Alcohol use: No  . Drug use: No    No family history on file.  No Known Allergies  Objective: Vitals:   02/08/18 1052  BP: 113/73  Pulse: 82  Temp: 98.4 F (36.9 C)  Weight: 124 lb 12.8 oz (56.6 kg)   Body mass index is 23.58 kg/m.  Physical Exam  Constitutional: She is oriented to person, place, and time. No distress.  She is in good spirits but did become tearful when I recommended starting treatment for chronic hepatitis B.  Cardiovascular: Normal rate and regular rhythm.  No murmur  heard. Pulmonary/Chest: Effort normal. She has no wheezes. She has no rales.  Abdominal: Soft. She exhibits no distension. There is no tenderness.  Neurological: She is alert and oriented to person, place, and time.  Psychiatric: Mood and affect normal.    Lab Results 01/24/2018 AST 41 ALT 57 Hepatitis B viral load 1,010,000 Hepatitis B e antigen negative; e antibody positive Liver biopsy in July 2018 showed mild portal hepatitis but no fibrosis   Problem List Items Addressed This Visit      High   Chronic hepatitis B (HCC)    Her chronic hepatitis B is progressing.  Her ALT is now more than twice normal and her viral load is over 1 million.  I have recommended starting treatment now.  I would prefer to use Vemlidy if we can get approval.  She will follow-up in 4 to 6 weeks.      Relevant Medications   Tenofovir Alafenamide Fumarate (VEMLIDY) 25 MG TABS       Cliffton AstersJohn Haidee Stogsdill, MD [facility] [phone] pager   [phone] cell 02/08/2018, 11:47 AM

## 2018-02-09 MED FILL — VEMLIDY 25 MG TABLET: 25 | 30 days supply | Qty: 30 | Fill #0

## 2018-02-21 ENCOUNTER — Ambulatory Visit: Payer: Medicaid Other | Admitting: Obstetrics & Gynecology

## 2018-03-01 ENCOUNTER — Ambulatory Visit: Payer: Medicaid Other | Admitting: Advanced Practice Midwife

## 2018-03-07 MED FILL — VEMLIDY 25 MG TABLET: 25 | 30 days supply | Qty: 30 | Fill #1

## 2018-03-16 ENCOUNTER — Encounter: Payer: Self-pay | Admitting: Internal Medicine

## 2018-03-16 ENCOUNTER — Ambulatory Visit (INDEPENDENT_AMBULATORY_CARE_PROVIDER_SITE_OTHER): Payer: Medicaid Other | Admitting: Internal Medicine

## 2018-03-16 DIAGNOSIS — Z23 Encounter for immunization: Secondary | ICD-10-CM | POA: Diagnosis not present

## 2018-03-16 DIAGNOSIS — B181 Chronic viral hepatitis B without delta-agent: Secondary | ICD-10-CM

## 2018-03-16 NOTE — Progress Notes (Signed)
         [facility]  Patient Active Problem List   Diagnosis Date Noted  . Chronic hepatitis B (HCC) 09/10/2012    Priority: High  . Yeast vaginitis 03/22/2014  . Lumbar strain 10/20/2013  . GERD (gastroesophageal reflux disease) 09/26/2012  . Anemia 09/26/2012  . Language barrier, cultural differences 09/10/2012  . Supervision of other high-risk pregnancy(V23.89) 09/10/2012  . Previous cesarean delivery affecting pregnancy, antepartum 09/07/2012    Patient's Medications  New Prescriptions   No medications on file  Previous Medications   TENOFOVIR ALAFENAMIDE FUMARATE (VEMLIDY) 25 MG TABS    Take 1 tablet (25 mg total) by mouth daily.  Modified Medications   No medications on file  Discontinued Medications   No medications on file    Subjective: Lanett started Vemlidy 1 month ago for her chronic hepatitis B.  She has a very low co-pay.  She has had no problems tolerating it and has not missed any doses.  She is feeling well.  Review of Systems: Review of Systems  Constitutional: Negative for chills, diaphoresis and fever.  Gastrointestinal: Negative for abdominal pain, diarrhea, nausea and vomiting.    Past Medical History:  Diagnosis Date  . Hepatitis B    diagnosed in pregnancy    Social History   Tobacco Use  . Smoking status: Never Smoker  . Smokeless tobacco: Never Used  Substance Use Topics  . Alcohol use: No  . Drug use: No    No family history on file.  No Known Allergies  Objective: Vitals:   03/16/18 0923  BP: 105/71  Pulse: 86  Temp: (!) 97.4 F (36.3 C)  TempSrc: Oral  Weight: 144 lb (65.3 kg)  Height: 5\' 1"  (1.549 m)   Body mass index is 27.21 kg/m.  Physical Exam  Constitutional:  She is smiling and in good spirits.  She is accompanied by her husband.  Abdominal: Soft. She exhibits no distension and no mass. There is no tenderness.  Skin: No rash noted.  Psychiatric: She has a normal mood and  affect.    Lab Results    Problem List Items Addressed This Visit      High   Chronic hepatitis B (HCC)    She is off to a good start with Vemlidy.  I will repeat her hepatitis B viral load and complete metabolic panel today and see her back in 1 month.      Relevant Orders   Hepatitis B DNA, ultraquantitative, PCR   Comprehensive metabolic panel       Cliffton AstersJohn Zvi Duplantis, MD [facility] [phone] pager   [phone] cell 03/16/2018, 9:47 AM

## 2018-03-16 NOTE — Addendum Note (Signed)
Addended by: Mariea ClontsGREEN, Tyese Finken D on: 03/16/2018 10:14 AM   Modules accepted: Orders

## 2018-03-16 NOTE — Assessment & Plan Note (Signed)
She is off to a good start with Vemlidy.  I will repeat her hepatitis B viral load and complete metabolic panel today and see her back in 1 month.

## 2018-03-18 LAB — COMPREHENSIVE METABOLIC PANEL
AG Ratio: 1.5 (calc) (ref 1.0–2.5)
ALBUMIN MSPROF: 4.1 g/dL (ref 3.6–5.1)
ALT: 17 U/L (ref 6–29)
AST: 18 U/L (ref 10–30)
Alkaline phosphatase (APISO): 68 U/L (ref 33–115)
BUN: 13 mg/dL (ref 7–25)
CO2: 25 mmol/L (ref 20–32)
CREATININE: 0.72 mg/dL (ref 0.50–1.10)
Calcium: 9.4 mg/dL (ref 8.6–10.2)
Chloride: 106 mmol/L (ref 98–110)
GLUCOSE: 98 mg/dL (ref 65–99)
Globulin: 2.8 g/dL (calc) (ref 1.9–3.7)
POTASSIUM: 3.9 mmol/L (ref 3.5–5.3)
Sodium: 138 mmol/L (ref 135–146)
Total Bilirubin: 0.6 mg/dL (ref 0.2–1.2)
Total Protein: 6.9 g/dL (ref 6.1–8.1)

## 2018-03-18 LAB — HEPATITIS B DNA, ULTRAQUANTITATIVE, PCR
Hepatitis B DNA (Calc): 2.21 Log IU/mL — ABNORMAL HIGH
Hepatitis B DNA: 161 IU/mL — ABNORMAL HIGH

## 2018-03-22 ENCOUNTER — Ambulatory Visit: Payer: Medicaid Other | Admitting: Student

## 2018-04-01 MED FILL — VEMLIDY 25 MG TABLET: 25 | 30 days supply | Qty: 30 | Fill #2

## 2018-04-25 ENCOUNTER — Encounter: Payer: Self-pay | Admitting: Internal Medicine

## 2018-04-25 ENCOUNTER — Ambulatory Visit: Payer: Medicaid Other | Admitting: Internal Medicine

## 2018-04-25 ENCOUNTER — Ambulatory Visit (INDEPENDENT_AMBULATORY_CARE_PROVIDER_SITE_OTHER): Payer: Medicaid Other | Admitting: Internal Medicine

## 2018-04-25 DIAGNOSIS — B181 Chronic viral hepatitis B without delta-agent: Secondary | ICD-10-CM

## 2018-04-25 NOTE — Assessment & Plan Note (Signed)
She is responding very well to initiation of Vemlidy.  Her viral load has come down significantly to 161 and her liver enzymes have normalized.  I do not notice any skin discoloration and doubt that she is having any significant adverse reaction to [facility].  She will continue it and follow-up after lab work in 6 months.

## 2018-04-25 NOTE — Progress Notes (Signed)
         [facility]  Patient Active Problem List   Diagnosis Date Noted  . Chronic hepatitis B (HCC) 09/10/2012    Priority: High  . Yeast vaginitis 03/22/2014  . Lumbar strain 10/20/2013  . GERD (gastroesophageal reflux disease) 09/26/2012  . Anemia 09/26/2012  . Language barrier, cultural differences 09/10/2012  . Supervision of other high-risk pregnancy(V23.89) 09/10/2012  . Previous cesarean delivery affecting pregnancy, antepartum 09/07/2012    Patient's Medications  New Prescriptions   No medications on file  Previous Medications   TENOFOVIR ALAFENAMIDE FUMARATE (VEMLIDY) 25 MG TABS    Take 1 tablet (25 mg total) by mouth daily.  Modified Medications   No medications on file  Discontinued Medications   No medications on file    Subjective: Brandi Cervantes is in for her routine follow-up visit.  Brandi Cervantes has had no problems obtaining, taking or tolerating her Vemlidy.  Brandi Cervantes has not missed a single dose since starting it 2 months ago.  Brandi Cervantes recently noticed slight yellowing of her skin but feels like it has gotten better recently.  Brandi Cervantes wonders if it was due to [facility].  Review of Systems: Review of Systems  Constitutional: Negative for chills, diaphoresis, fever and weight loss.  Gastrointestinal: Negative for abdominal pain, diarrhea, nausea and vomiting.  Skin:       As noted in HPI.    Past Medical History:  Diagnosis Date  . Hepatitis B    diagnosed in pregnancy    Social History   Tobacco Use  . Smoking status: Never Smoker  . Smokeless tobacco: Never Used  Substance Use Topics  . Alcohol use: No  . Drug use: No    No family history on file.  No Known Allergies  Objective: Vitals:   04/25/18 1349  BP: 110/74  Pulse: 73  Temp: 98.4 F (36.9 C)  Weight: 146 lb 12.8 oz (66.6 kg)   Body mass index is 27.74 kg/m.  Physical Exam  Constitutional: Brandi Cervantes is oriented to person, place, and time.  Brandi Cervantes is in very good spirits as usual.    Eyes: Conjunctivae are normal.  Brandi Cervantes has no scleral icterus.  Abdominal: Soft. Brandi Cervantes exhibits no distension and no mass. There is no tenderness.  Neurological: Brandi Cervantes is alert and oriented to person, place, and time.  Skin: No rash noted.  I do not note any discoloration of her skin.  Psychiatric: Brandi Cervantes has a normal mood and affect.    Lab Results    Problem List Items Addressed This Visit      High   Chronic hepatitis B (HCC)    Brandi Cervantes is responding very well to initiation of Vemlidy.  Her viral load has come down significantly to 161 and her liver enzymes have normalized.  I do not notice any skin discoloration and doubt that Brandi Cervantes is having any significant adverse reaction to [facility].  Brandi Cervantes will continue it and follow-up after lab work in 6 months.      Relevant Orders   Hepatitis B DNA, ultraquantitative, PCR   Comprehensive metabolic panel   Hepatitis B e antibody   Hepatitis B e antigen       Cliffton Asters, MD [facility] for Infectious Disease [facility] [phone] pager   [phone] cell 04/25/2018, 2:04 PM

## 2018-04-26 ENCOUNTER — Ambulatory Visit: Payer: Medicaid Other | Admitting: Internal Medicine

## 2018-04-29 MED FILL — VEMLIDY 25 MG TABLET: 25 | 30 days supply | Qty: 30 | Fill #3

## 2018-05-23 MED FILL — VEMLIDY 25 MG TABLET: 25 | 30 days supply | Qty: 30 | Fill #4

## 2018-06-15 MED FILL — VEMLIDY 25 MG TABLET: 25 | 30 days supply | Qty: 30 | Fill #5

## 2018-07-07 MED FILL — VEMLIDY 25 MG TABLET: 25 | 30 days supply | Qty: 30 | Fill #6

## 2018-07-29 MED FILL — VEMLIDY 25 MG TABLET: 25 | 30 days supply | Qty: 30 | Fill #7

## 2018-08-22 MED FILL — VEMLIDY 25 MG TABLET: 25 | 30 days supply | Qty: 30 | Fill #8

## 2018-09-13 MED FILL — VEMLIDY 25 MG TABLET: 25 | 30 days supply | Qty: 30 | Fill #9

## 2018-10-05 MED FILL — VEMLIDY 25 MG TABLET: 25 | 30 days supply | Qty: 30 | Fill #10

## 2018-10-07 ENCOUNTER — Telehealth: Payer: Medicaid Other | Admitting: Family

## 2018-10-07 DIAGNOSIS — R059 Cough, unspecified: Secondary | ICD-10-CM

## 2018-10-07 DIAGNOSIS — R05 Cough: Secondary | ICD-10-CM

## 2018-10-07 MED ORDER — PROMETHAZINE-DM 6.25-15 MG/5ML PO SYRP: 5.0000 mL | ORAL_SOLUTION | Freq: Four times a day (QID) | ORAL | 0 refills | Status: DC | PRN

## 2018-10-07 NOTE — Progress Notes (Signed)
E-Visit for Corona Virus Screening  Based on your current symptoms, it seems unlikely that your symptoms are related to the Coronavirus.   Coronavirus disease 2019 (COVID-19) is a respiratory illness that can spread from person to person. The virus that causes COVID-19 is a new virus that was first identified in the country of [place] but is now found in multiple other countries and has spread to the [place].  Symptoms associated with the virus are mild to severe fever, cough, and shortness of breath. There is currently no vaccine to protect against COVID-19, and there is no specific antiviral treatment for the virus.   To be considered HIGH RISK for Coronavirus (COVID-19), you have to meet the following criteria:  . Traveled to [place], [place], [place], [place] or [place]; or in the [place] to [place], [place], [place], or [place]; and have fever, cough, and shortness of breath within the last 2 weeks of travel OR  . Been in close contact with a person diagnosed with COVID-19 within the last 2 weeks and have fever, cough, and shortness of breath  . IF YOU DO NOT MEET THESE CRITERIA, YOU ARE CONSIDERED LOW RISK FOR COVID-19.   It is vitally important that if you feel that you have an infection such as this virus or any other virus that you stay home and away from places where you may spread it to others.  You should self-quarantine for 14 days if you have symptoms that could potentially be coronavirus and avoid contact with people age 65 and older.   You can use medication such as A prescription cough medication called Phenergan DM 6.25 mg/15 mg. You make take one teaspoon / 5 ml every 4-6 hours as needed for cough  You may also take acetaminophen (Tylenol) as needed for fever.   Reduce your risk of any infection by using the same precautions used for avoiding the common cold or flu:  . Wash your hands often with soap and warm water for at least 20 seconds.  If soap and water are  not readily available, use an alcohol-based hand sanitizer with at least 60% alcohol.  . If coughing or sneezing, cover your mouth and nose by coughing or sneezing into the elbow areas of your shirt or coat, into a tissue or into your sleeve (not your hands). . Avoid shaking hands with others and consider head nods or verbal greetings only. . Avoid touching your eyes, nose, or mouth with unwashed hands.  . Avoid close contact with people who are sick. . Avoid places or events with large numbers of people in one location, like concerts or sporting events. . Carefully consider travel plans you have or are making. . If you are planning any travel outside or inside the [place], visit the CDC's Travelers' Health webpage for the latest health notices. . If you have some symptoms but not all symptoms, continue to monitor at home and seek medical attention if your symptoms worsen. . If you are having a medical emergency, call 911.  HOME CARE . Only take medications as instructed by your medical team. . Drink plenty of fluids and get plenty of rest. . A steam or ultrasonic humidifier can help if you have congestion.   GET HELP RIGHT AWAY IF: . You develop worsening fever. . You become short of breath . You cough up blood. . Your symptoms become more severe MAKE SURE YOU   Understand these instructions.  Will watch your condition.    Will get help right away if you are not doing well or get worse.  Your e-visit answers were reviewed by a board certified advanced clinical practitioner to complete your personal care plan.  Depending on the condition, your plan could have included both over the counter or prescription medications.  If there is a problem please reply once you have received a response from your provider. Your safety is important to us.  If you have drug allergies check your prescription carefully.    You can use MyChart to ask questions about today's visit, request a non-urgent call back,  or ask for a work or school excuse for 24 hours related to this e-Visit. If it has been greater than 24 hours you will need to follow up with your provider, or enter a new e-Visit to address those concerns. You will get an e-mail in the next two days asking about your experience.  I hope that your e-visit has been valuable and will speed your recovery. Thank you for using e-visits.    

## 2018-10-11 ENCOUNTER — Other Ambulatory Visit: Payer: Medicaid Other

## 2018-10-25 ENCOUNTER — Ambulatory Visit: Payer: Medicaid Other | Admitting: Internal Medicine

## 2018-10-28 MED FILL — VEMLIDY 25 MG TABLET: 25 | 30 days supply | Qty: 30 | Fill #11

## 2018-11-07 ENCOUNTER — Other Ambulatory Visit: Payer: Medicaid Other

## 2018-11-07 ENCOUNTER — Other Ambulatory Visit: Payer: Self-pay

## 2018-11-07 DIAGNOSIS — B181 Chronic viral hepatitis B without delta-agent: Secondary | ICD-10-CM

## 2018-11-16 LAB — COMPREHENSIVE METABOLIC PANEL
AG Ratio: 1.7 (calc) (ref 1.0–2.5)
ALT: 11 U/L (ref 6–29)
AST: 16 U/L (ref 10–30)
Albumin: 4.2 g/dL (ref 3.6–5.1)
Alkaline phosphatase (APISO): 62 U/L (ref 31–125)
BUN: 10 mg/dL (ref 7–25)
CO2: 29 mmol/L (ref 20–32)
Calcium: 9.4 mg/dL (ref 8.6–10.2)
Chloride: 106 mmol/L (ref 98–110)
Creat: 0.68 mg/dL (ref 0.50–1.10)
Globulin: 2.5 g/dL (calc) (ref 1.9–3.7)
Glucose, Bld: 82 mg/dL (ref 65–99)
Potassium: 4.1 mmol/L (ref 3.5–5.3)
Sodium: 141 mmol/L (ref 135–146)
Total Bilirubin: 0.4 mg/dL (ref 0.2–1.2)
Total Protein: 6.7 g/dL (ref 6.1–8.1)

## 2018-11-16 LAB — HEPATITIS B E ANTIGEN: Hep B E Ag: NONREACTIVE

## 2018-11-16 LAB — HEPATITIS B DNA, ULTRAQUANTITATIVE, PCR
Hepatitis B DNA (Calc): <1 Log IU/mL
Hepatitis B DNA: <10 IU/mL

## 2018-11-16 LAB — HEPATITIS B E ANTIBODY: Hep B E Ab: REACTIVE — AB

## 2018-11-18 ENCOUNTER — Other Ambulatory Visit: Payer: Self-pay | Admitting: Pharmacist

## 2018-11-18 DIAGNOSIS — B181 Chronic viral hepatitis B without delta-agent: Secondary | ICD-10-CM

## 2018-11-22 ENCOUNTER — Telehealth: Payer: Self-pay | Admitting: Pharmacy Technician

## 2018-11-22 MED FILL — VEMLIDY 25 MG TABLET: 25 | 30 days supply | Qty: 30 | Fill #0

## 2018-11-22 NOTE — Telephone Encounter (Signed)
[facility] Patient Advocate Encounter   Received notification from [employer] that prior authorization for Wynonia Sours is required.   PA submitted on 11/22/2018 Key [id number] W Status is pending. Will continually check [place] tracks for status update.     [facility] will continue to follow and update [facility] once prior authorization is approved.   Beulah Gandy, CPhT Specialty Pharmacy Patient [facility] for Infectious Disease Phone: [phone] Fax: [phone] 11/22/2018 11:34 AM

## 2018-11-22 NOTE — Telephone Encounter (Signed)
[facility] Patient Advocate Encounter  Prior Authorization for Wynonia Sours has been approved.    PA# [id number] Effective dates: 11/22/2018 through 11/22/2019  Patients co-pay is $3.   [facility] will continue to follow.  Netty Starring. Dimas Aguas CPhT Specialty Pharmacy Patient [facility] for Infectious Disease Phone: [phone] Fax:  [phone]

## 2018-11-24 ENCOUNTER — Ambulatory Visit: Payer: Medicaid Other | Admitting: Internal Medicine

## 2018-12-14 ENCOUNTER — Ambulatory Visit: Payer: Medicaid Other | Admitting: Internal Medicine

## 2018-12-14 ENCOUNTER — Encounter: Payer: Self-pay | Admitting: Internal Medicine

## 2018-12-14 ENCOUNTER — Other Ambulatory Visit: Payer: Self-pay

## 2018-12-14 DIAGNOSIS — B181 Chronic viral hepatitis B without delta-agent: Secondary | ICD-10-CM | POA: Diagnosis not present

## 2018-12-14 MED FILL — VEMLIDY 25 MG TABLET: 25 | 30 days supply | Qty: 30 | Fill #1

## 2018-12-14 NOTE — Assessment & Plan Note (Signed)
Her hepatitis B has come under perfect control since starting Vemlidy last year.  She will continue it and follow-up after lab work in 6 months.

## 2018-12-14 NOTE — Progress Notes (Signed)
[facility]  Patient Active Problem List   Diagnosis Date Noted  . Chronic hepatitis B (HCC) 09/10/2012    Priority: High  . Yeast vaginitis 03/22/2014  . Lumbar strain 10/20/2013  . GERD (gastroesophageal reflux disease) 09/26/2012  . Anemia 09/26/2012  . Language barrier, cultural differences 09/10/2012  . Supervision of other high-risk pregnancy(V23.89) 09/10/2012  . Previous cesarean delivery affecting pregnancy, antepartum 09/07/2012    Patient's Medications  New Prescriptions   No medications on file  Previous Medications   PROMETHAZINE-DEXTROMETHORPHAN (PROMETHAZINE-DM) 6.25-15 MG/5ML SYRUP    Take 5 mLs by mouth 4 (four) times daily as needed.   VEMLIDY 25 MG TABS    TAKE 1 TABLET (25 MG TOTAL) BY MOUTH DAILY.  Modified Medications   No medications on file  Discontinued Medications   No medications on file    Subjective: Brandi Cervantes is in for her routine follow-up visit.  She has had no problems obtaining, taking or tolerating her Vemlidy.  She has not missed a single dose since starting it 2 months ago.  She recently noticed slight yellowing of her skin but feels like it has gotten better recently.  [facility]  Review of Systems: Review of Systems  Constitutional: Negative for chills, diaphoresis, fever and weight loss.  Gastrointestinal: Negative for abdominal pain, diarrhea, nausea and vomiting.  Skin:       As noted in HPI.    Past Medical History:  Diagnosis Date  . Hepatitis B    diagnosed in pregnancy    Social History   Tobacco Use  . Smoking status: Never Smoker  . Smokeless tobacco: Never Used  Substance Use Topics  . Alcohol use: No  . Drug use: No    No family history on file.  No Known Allergies  Objective: Vitals:   12/14/18 0934  BP: 110/70  Pulse: 78  Temp: 98.2 F (36.8 C)  Weight: 135 lb (61.2 kg)   Body mass index is 25.51 kg/m.  Physical Exam Constitutional:       Comments: She is in very good spirits as usual.  Eyes:     Conjunctiva/sclera: Conjunctivae normal.     Comments: She has no scleral icterus.  Abdominal:     General: There is no distension.     Palpations: Abdomen is soft. There is no mass.     Tenderness: There is no abdominal tenderness.  Skin:    Findings: No rash.     Comments: I do not note any discoloration of her skin.  Neurological:     Mental Status: She is alert and oriented to person, place, and time.     Lab Results CMP     Component Value Date/Time   NA 141 11/07/2018 1338   K 4.1 11/07/2018 1338   CL 106 11/07/2018 1338   CO2 29 11/07/2018 1338   GLUCOSE 82 11/07/2018 1338   BUN 10 11/07/2018 1338   CREATININE 0.68 11/07/2018 1338   CALCIUM 9.4 11/07/2018 1338   PROT 6.7 11/07/2018 1338   ALBUMIN 4.0 10/01/2016 1606   AST 16 11/07/2018 1338   ALT 11 11/07/2018 1338   ALKPHOS 78 10/01/2016 1606   BILITOT 0.4 11/07/2018 1338   GFRNONAA >60 01/22/2017 1305   GFRNONAA >89 03/06/2014 1407   GFRAA >60 01/22/2017 1305   GFRAA >89 03/06/2014 1407   Hepatitis B DNA viral load 11/07/2018: Less than 10  Problem List Items Addressed This Visit      High   Chronic hepatitis B ([place])    Her hepatitis B has come under perfect control since starting Vemlidy last year.  She will continue it and follow-up after lab work in 6 months.      Relevant Orders   Hepatitis B DNA, ultraquantitative, PCR   Hepatitis B e antibody   Hepatitis B e antigen   Comprehensive metabolic panel       Michel Bickers, MD [facility] for Infectious [place] [phone] pager   [phone] cell 12/14/2018, 9:43 AM

## 2019-01-05 MED FILL — VEMLIDY 25 MG TABLET: 25 | 30 days supply | Qty: 30 | Fill #2

## 2019-01-27 MED FILL — VEMLIDY 25 MG TABLET: 25 | 30 days supply | Qty: 30 | Fill #3

## 2019-02-18 MED FILL — VEMLIDY 25 MG TABLET: 25 | 30 days supply | Qty: 30 | Fill #4

## 2019-03-13 DIAGNOSIS — H52221 Regular astigmatism, right eye: Secondary | ICD-10-CM | POA: Diagnosis not present

## 2019-03-14 MED FILL — VEMLIDY 25 MG TABLET: 25 | 30 days supply | Qty: 30 | Fill #5

## 2019-04-06 MED FILL — VEMLIDY 25 MG TABLET: 25 | 30 days supply | Qty: 30 | Fill #6

## 2019-04-29 MED FILL — VEMLIDY 25 MG TABLET: 25 | 30 days supply | Qty: 30 | Fill #7

## 2019-05-22 MED FILL — VEMLIDY 25 MG TABLET: 25 | 30 days supply | Qty: 30 | Fill #8

## 2019-06-06 ENCOUNTER — Other Ambulatory Visit: Payer: Self-pay

## 2019-06-06 ENCOUNTER — Other Ambulatory Visit: Payer: Medicaid Other

## 2019-06-06 DIAGNOSIS — B181 Chronic viral hepatitis B without delta-agent: Secondary | ICD-10-CM | POA: Diagnosis not present

## 2019-06-10 LAB — COMPREHENSIVE METABOLIC PANEL WITH GFR
AG Ratio: 1.9 (calc) (ref 1.0–2.5)
ALT: 13 U/L (ref 6–29)
AST: 16 U/L (ref 10–30)
Albumin: 4.3 g/dL (ref 3.6–5.1)
Alkaline phosphatase (APISO): 79 U/L (ref 31–125)
BUN: 8 mg/dL (ref 7–25)
CO2: 26 mmol/L (ref 20–32)
Calcium: 9.4 mg/dL (ref 8.6–10.2)
Chloride: 106 mmol/L (ref 98–110)
Creat: 0.71 mg/dL (ref 0.50–1.10)
Globulin: 2.3 g/dL (ref 1.9–3.7)
Glucose, Bld: 92 mg/dL (ref 65–99)
Potassium: 4.5 mmol/L (ref 3.5–5.3)
Sodium: 142 mmol/L (ref 135–146)
Total Bilirubin: 0.4 mg/dL (ref 0.2–1.2)
Total Protein: 6.6 g/dL (ref 6.1–8.1)

## 2019-06-10 LAB — HEPATITIS B E ANTIGEN: Hep B E Ag: NONREACTIVE

## 2019-06-10 LAB — HEPATITIS B DNA, ULTRAQUANTITATIVE, PCR
Hepatitis B DNA (Calc): <1 Log IU/mL
Hepatitis B DNA: <10 IU/mL

## 2019-06-10 LAB — HEPATITIS B E ANTIBODY: Hep B E Ab: REACTIVE — AB

## 2019-06-15 MED FILL — VEMLIDY 25 MG TABLET: 25 | 30 days supply | Qty: 30 | Fill #9

## 2019-06-20 ENCOUNTER — Encounter: Payer: Self-pay | Admitting: Internal Medicine

## 2019-06-20 ENCOUNTER — Ambulatory Visit: Payer: Medicaid Other | Admitting: Internal Medicine

## 2019-06-20 ENCOUNTER — Other Ambulatory Visit: Payer: Self-pay

## 2019-06-20 DIAGNOSIS — B181 Chronic viral hepatitis B without delta-agent: Secondary | ICD-10-CM

## 2019-06-20 DIAGNOSIS — Z23 Encounter for immunization: Secondary | ICD-10-CM

## 2019-06-20 NOTE — Progress Notes (Signed)
Brandi Cerv[facility] for Infectious Disease  Patient Active Problem List   Diagnosis Date Noted  . Chronic hepatitis B ([place]) 09/10/2012    Priority: High  . Yeast vaginitis 03/22/2014  . Lumbar strain 10/20/2013  . GERD (gastroesophageal reflux disease) 09/26/2012  . Anemia 09/26/2012  . Language barrier, cultural differences 09/10/2012  . Supervision of other high-risk pregnancy(V23.89) 09/10/2012  . Previous cesarean delivery affecting pregnancy, antepartum 09/07/2012    Patient's Medications  New Prescriptions   No medications on file  Previous Medications   PROMETHAZINE-DEXTROMETHORPHAN (PROMETHAZINE-DM) 6.25-15 MG/5ML SYRUP    Take 5 mLs by mouth 4 (four) times daily as needed.   VEMLIDY 25 MG TABS    TAKE 1 TABLET (25 MG TOTAL) BY MOUTH DAILY.  Modified Medications   No medications on file  Discontinued Medications   No medications on file    Subjective: Brandi Cervantes is in for her routine visit.  She has not had any problems obtaining, taking or tolerating her Vemlidy.  She takes it each morning around 7:30 AM.  She is occasionally a little bit late taking it but has never missed a dose.  She is feeling well.  Occasionally she will have headaches for which she will take Advil but otherwise she is not having any problems.  She has not had any fever, abdominal pain, nausea or vomiting.  Review of Systems: Review of Systems  Constitutional: Negative for fever and weight loss.  Respiratory: Negative for cough and shortness of breath.   Cardiovascular: Negative for chest pain.  Gastrointestinal: Negative for abdominal pain, diarrhea, nausea and vomiting.    Past Medical History:  Diagnosis Date  . Hepatitis B    diagnosed in pregnancy    Social History   Tobacco Use  . Smoking status: Never Smoker  . Smokeless tobacco: Never Used  Substance Use Topics  . Alcohol use: No  . Drug use: No    No family history on file.  No Known Allergies  Objective: Vitals:     06/20/19 1052  BP: 112/76  Pulse: 96  Temp: 98 F (36.7 C)  TempSrc: Oral  Weight: 176 lb (79.8 kg)   Body mass index is 33.25 kg/m.  Physical Exam Constitutional:      Comments: She is smiling and in good spirits as usual.  Cardiovascular:     Rate and Rhythm: Normal rate and regular rhythm.     Heart sounds: No murmur.  Pulmonary:     Effort: Pulmonary effort is normal.     Breath sounds: Normal breath sounds.  Abdominal:     General: There is no distension.     Palpations: Abdomen is soft. There is no mass.     Tenderness: There is no abdominal tenderness.  Psychiatric:        Mood and Affect: Mood normal.     Lab Results 06/06/2019 Hepatitis B DNA less than 10 Hepatitis B E antigen negative Hepatitis B E antibody positive CMP     Component Value Date/Time   NA 142 06/06/2019 1445   K 4.5 06/06/2019 1445   CL 106 06/06/2019 1445   CO2 26 06/06/2019 1445   GLUCOSE 92 06/06/2019 1445   BUN 8 06/06/2019 1445   CREATININE 0.71 06/06/2019 1445   CALCIUM 9.4 06/06/2019 1445   PROT 6.6 06/06/2019 1445   ALBUMIN 4.0 10/01/2016 1606   AST 16 06/06/2019 1445   ALT 13 06/06/2019 1445   ALKPHOS 78 10/01/2016  1606   BILITOT 0.4 06/06/2019 1445   GFRNONAA >60 01/22/2017 1305   GFRNONAA >89 03/06/2014 1407   GFRAA >60 01/22/2017 1305   GFRAA >89 03/06/2014 1407     Problem List Items Addressed This Visit      High   Chronic hepatitis B (HCC)    Her virus remains undetectable.  She has thought about stopping the medication because she says that she does not like to take medication but she does understand that it is best for her to keep taking it.  She will follow-up after lab work in 6 months.  She received her influenza vaccine today.      Relevant Orders   Hepatitis B DNA, Ultraquantitative, PCR   Hepatitis B E Antibody   Hepatitis B E Antigen   6 month CMP       Cliffton Asters, MD [facility] for Infectious Disease [facility] [phone] pager   [phone] cell 06/20/2019, 11:09 AM

## 2019-06-20 NOTE — Assessment & Plan Note (Signed)
Her virus remains undetectable.  She has thought about stopping the medication because she says that she does not like to take medication but she does understand that it is best for her to keep taking it.  She will follow-up after lab work in 6 months.  She received her influenza vaccine today.

## 2019-07-07 MED FILL — VEMLIDY 25 MG TABLET: 25 | 30 days supply | Qty: 30 | Fill #10

## 2019-08-01 MED FILL — VEMLIDY 25 MG TABLET: 25 | 30 days supply | Qty: 30 | Fill #11

## 2019-08-26 ENCOUNTER — Other Ambulatory Visit: Payer: Self-pay | Admitting: Pharmacist

## 2019-08-26 DIAGNOSIS — B181 Chronic viral hepatitis B without delta-agent: Secondary | ICD-10-CM

## 2019-08-28 ENCOUNTER — Telehealth: Payer: Self-pay | Admitting: Pharmacy Technician

## 2019-08-28 NOTE — Telephone Encounter (Addendum)
[facility] Patient Advocate Encounter   [place] that [place] for Brandi Cervantes is required.   PA submitted on 08/28/2019 Key [id number] W Status is APPROVED.    Pharmacy has been notified.   Netty Starring. Dimas Aguas CPhT Specialty Pharmacy Patient [facility] for Infectious Disease Phone: [phone] Fax:  [phone]

## 2019-08-29 MED FILL — VEMLIDY 25 MG TABLET: 25 | 30 days supply | Qty: 30 | Fill #0

## 2019-09-21 MED FILL — VEMLIDY 25 MG TABLET: 25 | 30 days supply | Qty: 30 | Fill #1

## 2019-10-16 MED FILL — VEMLIDY 25 MG TABLET: 25 | 30 days supply | Qty: 30 | Fill #2

## 2019-11-30 ENCOUNTER — Other Ambulatory Visit: Payer: Medicaid Other

## 2019-12-01 MED FILL — VEMLIDY 25 MG TABLET: 25 | 30 days supply | Qty: 30 | Fill #4

## 2019-12-14 ENCOUNTER — Ambulatory Visit: Payer: Medicaid Other | Admitting: Internal Medicine

## 2019-12-14 ENCOUNTER — Telehealth: Payer: Self-pay | Admitting: Pharmacy Technician

## 2019-12-14 NOTE — Telephone Encounter (Signed)
[facility] Patient Advocate Encounter  Patient talked with the [facility] Specialty pharmacy team about help getting medication as [place] is moving out [place] the [place].  I left a HIPPA complaint voicemail asking her to call me.  We will discuss options to appeal to her insurance for an additional fill of Vemlidy.  Brandi Cervantes. Dimas Aguas CPhT Specialty Pharmacy Patient [facility] for Infectious Disease Phone: [phone] Fax:  [phone]

## 2019-12-18 ENCOUNTER — Other Ambulatory Visit: Payer: Self-pay

## 2019-12-18 ENCOUNTER — Other Ambulatory Visit: Payer: Medicaid Other

## 2019-12-18 DIAGNOSIS — B181 Chronic viral hepatitis B without delta-agent: Secondary | ICD-10-CM | POA: Diagnosis not present

## 2019-12-20 NOTE — Telephone Encounter (Signed)
[facility] Patient Advocate Encounter  Spoke with the patient today.  She is not sure she will be moving out to the country, but did ask if there is Vemlidy medication assistance in other countries.  I gave her the [employer] phone number so that she can call them and ask.    Netty Starring. Dimas Aguas CPhT Specialty Pharmacy Patient [facility] for Infectious Disease Phone: [phone] Fax:  [phone]

## 2019-12-21 LAB — COMPREHENSIVE METABOLIC PANEL WITH GFR
AG Ratio: 1.5 (calc) (ref 1.0–2.5)
ALT: 13 U/L (ref 6–29)
AST: 14 U/L (ref 10–30)
Albumin: 4.1 g/dL (ref 3.6–5.1)
Alkaline phosphatase (APISO): 87 U/L (ref 31–125)
BUN: 10 mg/dL (ref 7–25)
CO2: 24 mmol/L (ref 20–32)
Calcium: 9.5 mg/dL (ref 8.6–10.2)
Chloride: 106 mmol/L (ref 98–110)
Creat: 0.79 mg/dL (ref 0.50–1.10)
Globulin: 2.8 g/dL (ref 1.9–3.7)
Glucose, Bld: 128 mg/dL — ABNORMAL HIGH (ref 65–99)
Potassium: 4.4 mmol/L (ref 3.5–5.3)
Sodium: 138 mmol/L (ref 135–146)
Total Bilirubin: 0.4 mg/dL (ref 0.2–1.2)
Total Protein: 6.9 g/dL (ref 6.1–8.1)

## 2019-12-21 LAB — HEPATITIS B DNA, ULTRAQUANTITATIVE, PCR
Hepatitis B DNA (Calc): <1 Log IU/mL
Hepatitis B DNA: <10 IU/mL

## 2019-12-21 LAB — HEPATITIS B E ANTIGEN: Hep B E Ag: NONREACTIVE

## 2019-12-21 LAB — HEPATITIS B E ANTIBODY: Hep B E Ab: REACTIVE — AB

## 2019-12-25 MED FILL — VEMLIDY 25 MG TABLET: 25 | 30 days supply | Qty: 30 | Fill #5

## 2020-01-02 ENCOUNTER — Other Ambulatory Visit: Payer: Self-pay

## 2020-01-02 ENCOUNTER — Telehealth (INDEPENDENT_AMBULATORY_CARE_PROVIDER_SITE_OTHER): Payer: Medicaid Other | Admitting: Internal Medicine

## 2020-01-02 DIAGNOSIS — B181 Chronic viral hepatitis B without delta-agent: Secondary | ICD-10-CM

## 2020-01-02 NOTE — Progress Notes (Signed)
Virtual Visit via Telephone Note  I connected with Brandi Cervantes on 01/02/20 at  4:00 PM EDT by telephone and verified that I am speaking with the correct person using two identifiers.  Location: Patient: Home Provider: [facility]   I discussed the limitations, risks, security and privacy concerns of performing an evaluation and management service by telephone and the availability of in person appointments. I also discussed with the patient that there may be a patient responsible charge related to this service. The patient expressed understanding and agreed to proceed.   History of Present Illness: I called and spoke with Brandi Cervantes and her husband today.  She has not had any problems obtaining, taking or tolerating her Vemlidy and denies missing any doses.  She is feeling well.   Observations/Objective: Labs from 12/18/2019 Liver enzymes normal Hepatitis B e antigen negative Hepatitis B e antibody positive Hepatitis B DNA viral load less than 10  Assessment and Plan: Her hepatitis B continues to be under excellent, long-term control on Vemlidy.  She will continue it and follow-up after lab work in 6 months.  They are considering moving back to the [place] involved if they do that daily simply follow-up here once annually for lab work.  I indicated that that would be okay.  Follow Up Instructions: Continue Vemlidy and follow-up here after lab work in 6 months.   I discussed the assessment and treatment plan with the patient. The patient was provided an opportunity to ask questions and all were answered. The patient agreed with the plan and demonstrated an understanding of the instructions.   The patient was advised to call back or seek an in-person evaluation if the symptoms worsen or if the condition fails to improve as anticipated.  I provided 12 minutes of non-face-to-face time during this encounter.   Cliffton Asters, MD

## 2020-01-18 ENCOUNTER — Telehealth: Payer: Self-pay | Admitting: Pharmacy Technician

## 2020-01-18 MED FILL — VEMLIDY 25 MG TABLET: 25 | 30 days supply | Qty: 30 | Fill #6

## 2020-01-18 NOTE — Telephone Encounter (Signed)
[facility] Patient Advocate Encounter  Prior Authorization for Wynonia Sours has been approved.    PA# [id number] Effective dates: 01/18/2020 through 01/17/2021    [facility] will continue to follow.  Netty Starring. Dimas Aguas CPhT Specialty Pharmacy Patient [facility] for Infectious Disease Phone: [phone] Fax:  [phone]

## 2020-01-25 ENCOUNTER — Encounter: Payer: Self-pay | Admitting: Family Medicine

## 2020-01-25 ENCOUNTER — Other Ambulatory Visit (HOSPITAL_COMMUNITY)
Admission: RE | Admit: 2020-01-25 | Discharge: 2020-01-25 | Disposition: A | Discharge status: Home / Self Care | Payer: Medicaid Other | Source: Ambulatory Visit | Attending: Family Medicine | Admitting: Family Medicine

## 2020-01-25 ENCOUNTER — Other Ambulatory Visit: Payer: Self-pay

## 2020-01-25 ENCOUNTER — Ambulatory Visit (INDEPENDENT_AMBULATORY_CARE_PROVIDER_SITE_OTHER): Payer: Medicaid Other | Admitting: Family Medicine

## 2020-01-25 VITALS — BP 100/60 | HR 87 | Ht 62.0 in | Wt 174.0 lb

## 2020-01-25 DIAGNOSIS — M545 Low back pain, unspecified: Secondary | ICD-10-CM

## 2020-01-25 DIAGNOSIS — Z113 Encounter for screening for infections with a predominantly sexual mode of transmission: Secondary | ICD-10-CM

## 2020-01-25 DIAGNOSIS — Z124 Encounter for screening for malignant neoplasm of cervix: Secondary | ICD-10-CM | POA: Insufficient documentation

## 2020-01-25 DIAGNOSIS — G8929 Other chronic pain: Secondary | ICD-10-CM

## 2020-01-25 DIAGNOSIS — Z1159 Encounter for screening for other viral diseases: Secondary | ICD-10-CM

## 2020-01-25 DIAGNOSIS — B181 Chronic viral hepatitis B without delta-agent: Secondary | ICD-10-CM

## 2020-01-25 DIAGNOSIS — K219 Gastro-esophageal reflux disease without esophagitis: Secondary | ICD-10-CM

## 2020-01-25 LAB — POCT WET PREP (WET MOUNT)
Clue Cells Wet Prep Whiff POC: NEGATIVE
Trichomonas Wet Prep HPF POC: ABSENT

## 2020-01-25 MED ORDER — FAMOTIDINE 10 MG PO TABS: 10.0000 mg | ORAL_TABLET | Freq: Two times a day (BID) | ORAL | 2 refills | Status: DC

## 2020-01-25 MED ORDER — ACETAMINOPHEN 500 MG PO TABS: 500.0000 mg | ORAL_TABLET | Freq: Four times a day (QID) | ORAL | 0 refills | Status: DC | PRN

## 2020-01-25 NOTE — Patient Instructions (Addendum)
It was great seeing you today!  Please check-out at the front desk before leaving the clinic. I'd like to see you back in 1 year but if you need to be seen earlier than that for any new issues we're happy to fit you in, just give [place] a call!  Visit Remembers: - Stop by the pharmacy to pick up the below medications - Continue to work on your healthy eating habits and incorporating exercise into your daily life. - Medicine Changes: Take Pepto Bismol for occasional acid reflux.  IF you have constant reflux (>1 week) start taking Pepcid for acid reflux , you can pick these medications at the pharmacy. Take Pepcid daily for the next 2 weeks then stop.  - For your stretch marks can use Vitamin E oil 2-3 times a day on your belly       Regarding lab work today:  Due to recent changes in healthcare laws, you may see the results of your imaging and laboratory studies on MyChart before your provider has had a chance to review them.  I understand that in some cases there may be results that are confusing or concerning to you. Not all laboratory results come back in the same time frame and you may be waiting for multiple results in order to interpret others.  Please give [place] 72 hours in order for your provider to thoroughly review all the results before contacting the office for clarification of your results. If everything is normal, you will get a letter in the mail or a message in My Chart. Please give [place] a call if you do not hear from [place] after 2 weeks.  Please bring all of your medications with you to each visit.    If you haven't already, sign up for My Chart to have easy access to your labs results, and communication with your primary care physician.  Feel free to call with any questions or concerns at any time, at [phone].   Take care,  Dr. Katherina Right [facility]

## 2020-01-25 NOTE — Progress Notes (Addendum)
New Patient Office Visit  Subjective:  Patient ID: Brandi Cervantes, female    DOB: [birth year]  Age: 24 y.o. MRN: [id number]  CC:  Chief Complaint  Patient presents with  . New Patient (Initial Visit)    HPI Brandi Cervantes presents to establish care.  Patient has not had a PCP, follows with infectious disease for Hep B and OBGYN. Complains of low back pain since having epidural years ago.  Worse with movement. Better with lying flat.  Took no medications for pain.  Has not tried heat or ice.  New Patient : PMHx: Hep B   Meds: Tenofovir   ALL: reviewed and updates as appropriate    SurgHx: C-section    GYNHx: [id number], hx of C-section, son 2012, daughter 2014, IUD placed 11/2016     FMHx:  DM and HTN in parents.   Social Hx:  Lives with husband, son and daughter. Likes to swim for exercise. Mana denies smoking, drinking alcohol and illicit drug use. Attends [employer].   Social History   Socioeconomic History  . Marital status: Married    Spouse name: Not on file  . Number of children: Not on file  . Years of education: Not on file  . Highest education level: Not on file  Occupational History  . Not on file  Tobacco Use  . Smoking status: Never Smoker  . Smokeless tobacco: Never Used  Substance and Sexual Activity  . Alcohol use: No  . Drug use: No  . Sexual activity: Yes    Partners: Male    Birth control/protection: None, I.U.D.  Other Topics Concern  . Not on file  Social History Narrative  . Not on file   Social Determinants of Health   Financial Resource Strain:   . Difficulty of Paying Living Expenses:   Food Insecurity:   . Worried About Programme researcher, broadcasting/film/video in the Last Year:   . Barista in the Last Year:   Transportation Needs:   . Freight forwarder (Medical):   Marland Kitchen Lack of Transportation (Non-Medical):   Physical Activity:   . Days of Exercise per Week:   . Minutes of Exercise per Session:   Stress:   . Feeling of Stress :   Social  Connections:   . Frequency of Communication with Friends and Family:   . Frequency of Social Gatherings with Friends and Family:   . Attends Religious Services:   . Active Member of Clubs or Organizations:   . Attends Banker Meetings:   Marland Kitchen Marital Status:   Intimate Partner Violence:   . Fear of Current or Ex-Partner:   . Emotionally Abused:   Marland Kitchen Physically Abused:   . Sexually Abused:     ROS Review of Systems  Constitutional: Negative for fever and unexpected weight change.  HENT: Negative for sore throat and trouble swallowing.   Eyes: Negative for visual disturbance.  Respiratory: Negative for cough and shortness of breath.   Cardiovascular: Negative for chest pain and leg swelling.  Gastrointestinal: Negative for abdominal pain, blood in stool, diarrhea, nausea and vomiting.  Genitourinary: Negative for dysuria and vaginal discharge.  Musculoskeletal: Negative for arthralgias and myalgias. Back pain: chronic lower   Skin: Negative for rash and wound.  Neurological: Negative for dizziness, syncope, weakness, numbness and headaches.  Psychiatric/Behavioral: Negative for dysphoric mood.    Objective:   Today's Vitals: BP (!) 100/60   Pulse 87   Ht 5\' 2"  (1.575 m)  Wt 174 lb (78.9 kg)   SpO2 98%   BMI 31.83 kg/m   Physical Exam Vitals reviewed. Exam conducted with a chaperone present.  Constitutional:      General: She is not in acute distress.    Appearance: She is normal weight. She is not ill-appearing.  HENT:     Head: Normocephalic and atraumatic.     Right Ear: External ear normal.     Left Ear: External ear normal.     Nose: Nose normal. No rhinorrhea.     Mouth/Throat:     Mouth: Mucous membranes are moist.     Pharynx: Oropharynx is clear. No oropharyngeal exudate or posterior oropharyngeal erythema.  Eyes:     General: No scleral icterus.       Right eye: No discharge.        Left eye: No discharge.     Extraocular Movements: Extraocular  movements intact.     Conjunctiva/sclera: Conjunctivae normal.     Pupils: Pupils are equal, round, and reactive to light.  Cardiovascular:     Rate and Rhythm: Normal rate and regular rhythm.     Pulses: Normal pulses.     Heart sounds: Normal heart sounds. No murmur heard.   Pulmonary:     Effort: Pulmonary effort is normal. No respiratory distress.     Breath sounds: Normal breath sounds.  Abdominal:     General: Bowel sounds are normal. There is no distension.     Palpations: Abdomen is soft. There is no mass.     Tenderness: There is no abdominal tenderness. There is no guarding.  Genitourinary:    General: Normal vulva.     Exam position: Lithotomy position.     Labia:        Right: No rash.        Left: No rash.      Vagina: Normal.     Cervix: Normal.     Uterus: Normal.      Adnexa: Right adnexa normal and left adnexa normal.     Comments: Wet prep and PAP performed  Musculoskeletal:        General: No swelling or tenderness. Normal range of motion.     Cervical back: Normal range of motion and neck supple. No tenderness.  Lymphadenopathy:     Cervical: No cervical adenopathy.  Skin:    General: Skin is warm and dry.     Capillary Refill: Capillary refill takes less than 2 seconds.     Findings: No rash.  Neurological:     Mental Status: She is alert and oriented to person, place, and time.     Comments: CN 2-12 grossly intact, sensation grossly intact upper and lower extremities      Assessment & Plan:   Problem List Items Addressed This Visit      Digestive   Chronic hepatitis B (HCC)    Follows with Dr. Orvan Falconer.  Will obtain CMP and CBC.       GERD (gastroesophageal reflux disease)    Stable.  Pepto Bismol for no more than five days.  If reflux continues, start Pepcid 10 mg BID.        Relevant Medications   famotidine (PEPCID AC) 10 MG tablet     Other   Low back pain    Chronic.  Tylenol PRN, no red flag signs.  Started after epidural. No  imaging at this time.  Likely lumbar strain, not likely spinal lesion/mass, ankylosing spondylitis, sciatica  or DDD      Relevant Medications   acetaminophen (TYLENOL) 500 MG tablet    Other Visit Diagnoses    Routine screening for STI (sexually transmitted infection)    -  Primary   Relevant Orders   Cervicovaginal ancillary only (Completed)   HCV Ab w Reflex to Quant PCR (Completed)   POCT Wet Prep [facility]) (Completed)   HIV antibody (with reflex) (Completed)   Screening for cervical cancer       Relevant Orders   Cytology - PAP   Chronic viral hepatitis B without delta agent and without coma (HCC)       Relevant Orders   Comprehensive metabolic panel (Completed)   HIV antibody (with reflex) (Completed)   CBC (Completed)   Mild acid reflux       Relevant Medications   famotidine (PEPCID AC) 10 MG tablet      Outpatient Encounter Medications as of 01/25/2020  Medication Sig  . acetaminophen (TYLENOL) 500 MG tablet Take 1 tablet (500 mg total) by mouth every 6 (six) hours as needed.  . famotidine (PEPCID AC) 10 MG tablet Take 1 tablet (10 mg total) by mouth 2 (two) times daily.  . promethazine-dextromethorphan (PROMETHAZINE-DM) 6.25-15 MG/5ML syrup Take 5 mLs by mouth 4 (four) times daily as needed.  . VEMLIDY 25 MG TABS TAKE 1 TABLET (25 MG TOTAL) BY MOUTH DAILY.   No facility-administered encounter medications on file as of 01/25/2020.    Follow-up: No follow-ups on file.   Katha Cabal, DO

## 2020-01-26 LAB — COMPREHENSIVE METABOLIC PANEL
ALT: 10 IU/L (ref 0–32)
AST: 20 IU/L (ref 0–40)
Albumin/Globulin Ratio: 1.9 (ref 1.2–2.2)
Albumin: 4.4 g/dL (ref 3.9–5.0)
Alkaline Phosphatase: 105 IU/L (ref 48–121)
BUN/Creatinine Ratio: 13 (ref 9–23)
BUN: 10 mg/dL (ref 6–20)
Bilirubin Total: 0.3 mg/dL (ref 0.0–1.2)
CO2: 22 mmol/L (ref 20–29)
Calcium: 9.3 mg/dL (ref 8.7–10.2)
Chloride: 102 mmol/L (ref 96–106)
Creatinine, Ser: 0.77 mg/dL (ref 0.57–1.00)
GFR calc Af Amer: 126 mL/min/{1.73_m2} (ref >59)
GFR calc non Af Amer: 109 mL/min/{1.73_m2} (ref >59)
Globulin, Total: 2.3 g/dL (ref 1.5–4.5)
Glucose: 83 mg/dL (ref 65–99)
Potassium: 4.4 mmol/L (ref 3.5–5.2)
Sodium: 142 mmol/L (ref 134–144)
Total Protein: 6.7 g/dL (ref 6.0–8.5)

## 2020-01-26 LAB — CBC
Hematocrit: 40.2 % (ref 34.0–46.6)
Hemoglobin: 13.5 g/dL (ref 11.1–15.9)
MCH: 29.1 pg (ref 26.6–33.0)
MCHC: 33.6 g/dL (ref 31.5–35.7)
MCV: 87 fL (ref 79–97)
Platelets: 254 10*3/uL (ref 150–450)
RBC: 4.64 x10E6/uL (ref 3.77–5.28)
RDW: 12.6 % (ref 11.7–15.4)
WBC: 6.3 10*3/uL (ref 3.4–10.8)

## 2020-01-26 LAB — HIV ANTIBODY (ROUTINE TESTING W REFLEX): HIV Screen 4th Generation wRfx: NONREACTIVE

## 2020-01-26 LAB — CERVICOVAGINAL ANCILLARY ONLY
Chlamydia: NEGATIVE
Comment: NEGATIVE
Comment: NORMAL
Neisseria Gonorrhea: NEGATIVE

## 2020-01-26 LAB — HCV AB W REFLEX TO QUANT PCR: HCV Ab: <0.1 s/co ratio (ref 0.0–0.9)

## 2020-01-26 LAB — HCV INTERPRETATION

## 2020-01-29 ENCOUNTER — Encounter: Payer: Self-pay | Admitting: Family Medicine

## 2020-01-29 LAB — CYTOLOGY - PAP: Diagnosis: NEGATIVE

## 2020-01-29 NOTE — Assessment & Plan Note (Signed)
Follows with Dr. Orvan Falconer.  Will obtain CMP and CBC.

## 2020-01-29 NOTE — Assessment & Plan Note (Signed)
Stable.  Pepto Bismol for no more than five days.  If reflux continues, start Pepcid 10 mg BID.

## 2020-01-29 NOTE — Assessment & Plan Note (Addendum)
Chronic.  Tylenol PRN, no red flag signs.  Started after epidural. No imaging at this time.  Likely lumbar strain, not likely spinal lesion/mass, ankylosing spondylitis, sciatica or DDD

## 2020-02-10 MED FILL — VEMLIDY 25 MG TABLET: 25 | 30 days supply | Qty: 30 | Fill #7

## 2020-02-28 DIAGNOSIS — U071 COVID-19: Secondary | ICD-10-CM

## 2020-02-28 HISTORY — DX: COVID-19: U07.1

## 2020-03-05 MED FILL — VEMLIDY 25 MG TABLET: 25 | 30 days supply | Qty: 30 | Fill #8

## 2020-03-11 ENCOUNTER — Other Ambulatory Visit: Payer: Self-pay

## 2020-03-11 ENCOUNTER — Ambulatory Visit (HOSPITAL_COMMUNITY)
Admission: EM | Admit: 2020-03-11 | Discharge: 2020-03-11 | Disposition: A | Discharge status: Home / Self Care | Payer: Medicaid Other | Attending: Family Medicine | Admitting: Family Medicine

## 2020-03-11 ENCOUNTER — Encounter (HOSPITAL_COMMUNITY): Payer: Self-pay | Admitting: Emergency Medicine

## 2020-03-11 DIAGNOSIS — T300 Burn of unspecified body region, unspecified degree: Secondary | ICD-10-CM | POA: Diagnosis not present

## 2020-03-11 MED ORDER — TETANUS-DIPHTH-ACELL PERTUSSIS 5-2.5-18.5 LF-MCG/0.5 IM SUSP
0.5000 mL | Freq: Once | INTRAMUSCULAR | Status: AC
  Administered 2020-03-11: 0.5 mL via INTRAMUSCULAR

## 2020-03-11 MED ORDER — TETANUS-DIPHTH-ACELL PERTUSSIS 5-2.5-18.5 LF-MCG/0.5 IM SUSP
INTRAMUSCULAR | Status: AC
  Filled 2020-03-11: qty 0.5

## 2020-03-11 MED ORDER — SILVER SULFADIAZINE 1 % EX CREA
TOPICAL_CREAM | CUTANEOUS | Status: AC
  Filled 2020-03-11: qty 85

## 2020-03-11 MED ORDER — SILVER SULFADIAZINE 1 % EX CREA
1.0000 | TOPICAL_CREAM | Freq: Two times a day (BID) | CUTANEOUS | 0 refills | Status: DC
Start: 2020-03-11 — End: 2020-03-16

## 2020-03-11 MED ORDER — DOXYCYCLINE HYCLATE 100 MG PO CAPS
100.0000 mg | ORAL_CAPSULE | Freq: Two times a day (BID) | ORAL | 0 refills | Status: AC
Start: 2020-03-11 — End: 2020-03-16
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
## 2020-03-12 MED FILL — DOXYCYCLINE HYCLATE 100 MG: 100 | 10 days supply | Qty: 10 | Fill #0

## 2020-03-16 ENCOUNTER — Encounter (HOSPITAL_COMMUNITY): Payer: Self-pay | Admitting: *Deleted

## 2020-03-16 ENCOUNTER — Ambulatory Visit (HOSPITAL_COMMUNITY)
Admission: EM | Admit: 2020-03-16 | Discharge: 2020-03-16 | Disposition: A | Discharge status: Home / Self Care | Payer: Medicaid Other | Attending: Family Medicine | Admitting: Family Medicine

## 2020-03-16 ENCOUNTER — Other Ambulatory Visit: Payer: Self-pay

## 2020-03-16 DIAGNOSIS — T300 Burn of unspecified body region, unspecified degree: Secondary | ICD-10-CM

## 2020-03-16 DIAGNOSIS — T22212A Burn of second degree of left forearm, initial encounter: Secondary | ICD-10-CM

## 2020-03-16 MED ORDER — SILVER SULFADIAZINE 1 % EX CREA
1.0000 | TOPICAL_CREAM | Freq: Two times a day (BID) | CUTANEOUS | 0 refills | Status: DC
Start: 2020-03-16 — End: 2020-03-16

## 2020-03-16 MED ORDER — LIDOCAINE HCL (PF) 1 % IJ SOLN
INTRAMUSCULAR | Status: AC
  Filled 2020-03-16: qty 30

## 2020-03-16 MED ORDER — SILVER SULFADIAZINE 1 % EX CREA
1.0000 | TOPICAL_CREAM | Freq: Two times a day (BID) | CUTANEOUS | 0 refills | Status: DC
Start: 2020-03-16 — End: 2020-07-24
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
## 2020-03-16 NOTE — Discharge Instructions (Addendum)
Continue twice daily wound care and apply silvadene cream applications.  Continue to monitor for signs of infection.  If wound begins to drain any green or yellow purulent drainage this is signs of infection return for evaluation otherwise no need to return here for any additional wound care. It can take up to 2 to 3 weeks for burn wounds to completely heal and pain to completely resolve.  Take Tylenol and ibuprofen as needed for pain.

## 2020-03-18 ENCOUNTER — Ambulatory Visit (HOSPITAL_COMMUNITY)
Admission: EM | Admit: 2020-03-18 | Discharge: 2020-03-18 | Disposition: A | Discharge status: Home / Self Care | Payer: Medicaid Other | Attending: Emergency Medicine | Admitting: Emergency Medicine

## 2020-03-18 ENCOUNTER — Encounter (HOSPITAL_COMMUNITY): Payer: Self-pay | Admitting: Emergency Medicine

## 2020-03-18 ENCOUNTER — Other Ambulatory Visit: Payer: Self-pay

## 2020-03-18 DIAGNOSIS — U071 COVID-19: Secondary | ICD-10-CM | POA: Diagnosis not present

## 2020-03-18 MED ORDER — ONDANSETRON HCL 4 MG PO TABS: 4.0000 mg | ORAL_TABLET | Freq: Three times a day (TID) | ORAL | 0 refills | Status: DC | PRN

## 2020-03-18 MED ORDER — BENZONATATE 100 MG PO CAPS: 100.0000 mg | ORAL_CAPSULE | Freq: Three times a day (TID) | ORAL | 0 refills | Status: AC | PRN

## 2020-03-18 MED ORDER — MELOXICAM 7.5 MG PO TABS: 7.5000 mg | ORAL_TABLET | Freq: Every day | ORAL | 1 refills | Status: DC

## 2020-03-18 MED ORDER — BENZONATATE 100 MG PO CAPS: 100.0000 mg | ORAL_CAPSULE | Freq: Three times a day (TID) | ORAL | 0 refills | Status: DC | PRN

## 2020-03-18 MED ORDER — ALBUTEROL SULFATE HFA 108 (90 BASE) MCG/ACT IN AERS: 1.0000 | INHALATION_SPRAY | Freq: Four times a day (QID) | RESPIRATORY_TRACT | 0 refills | Status: DC | PRN

## 2020-03-18 MED FILL — PROAIR HFA 90 MCG INHALER: 108 (90 BAS | 25 days supply | Qty: 9 | Fill #0

## 2020-03-18 MED FILL — MELOXICAM 7.5 MG TABLET: 7.5 | 30 days supply | Qty: 30 | Fill #0

## 2020-03-18 MED FILL — BENZONATATE 100 MG CAPS: 100 | 7 days supply | Qty: 21 | Fill #0

## 2020-03-18 NOTE — Discharge Instructions (Addendum)
You can use 2 puffs of albuterol every 4 hours as needed for shortness of breath. He can use Zofran every 4 hours as needed for nausea. You have been prescribed Tessalon Perles for cough. You can take 1 tablet of meloxicam daily for pharyngitis and body aches.

## 2020-03-18 NOTE — ED Notes (Signed)
Requests medication for fever and nausea, notes that she has hep B and wants to ensure medication is safe for her condition.

## 2020-03-18 NOTE — ED Provider Notes (Signed)
Emergency Department Provider Note  ____________________________________________  Time seen: Approximately 9:04 AM  I have reviewed the triage vital signs and the nursing notes.   HISTORY  Chief Complaint Covid Exposure and Fever   Historian Patient     HPI Brandi Cervantes is a 24 y.o. female presents to the emergency department with headache, sporadic cough, pharyngitis and low-grade fever.  Patient son was recently tested for COVID-19 and tested positive.  Patient is requesting medications for headache and pharyngitis.  She denies chest pain, chest tightness or shortness of breath.  She states that her entire family is symptomatic at this time.  Patient has nausea but no emesis.  No diarrhea.  No recent travel.   Past Medical History:  Diagnosis Date  . Hepatitis B    diagnosed in pregnancy  . Hepatitis B      Immunizations up to date:  Yes.     Past Medical History:  Diagnosis Date  . Hepatitis B    diagnosed in pregnancy  . Hepatitis B     Patient Active Problem List   Diagnosis Date Noted  . Low back pain 10/20/2013  . GERD (gastroesophageal reflux disease) 09/26/2012  . Anemia 09/26/2012  . Language barrier, cultural differences 09/10/2012  . Supervision of other high-risk pregnancy(V23.89) 09/10/2012  . Chronic hepatitis B (HCC) 09/10/2012  . Previous cesarean delivery affecting pregnancy, antepartum 09/07/2012    Past Surgical History:  Procedure Laterality Date  . CESAREAN SECTION      Prior to Admission medications   Medication Sig Start Date End Date Taking? Authorizing Provider  VEMLIDY 25 MG TABS TAKE 1 TABLET (25 MG TOTAL) BY MOUTH DAILY. 08/28/19  Yes Kuppelweiser, Cassie L, RPH-CPP  acetaminophen (TYLENOL) 500 MG tablet Take 1 tablet (500 mg total) by mouth every 6 (six) hours as needed. 01/25/20   Brimage, Seward Meth, DO  albuterol (VENTOLIN HFA) 108 (90 Base) MCG/ACT inhaler Inhale 1-2 puffs into the lungs every 6 (six) hours as needed for  wheezing or shortness of breath. 03/18/20   Orvil Feil, PA-C  benzonatate (TESSALON PERLES) 100 MG capsule Take 1 capsule (100 mg total) by mouth 3 (three) times daily as needed for up to 7 days for cough. 03/18/20 03/25/20  Orvil Feil, PA-C  famotidine (PEPCID AC) 10 MG tablet Take 1 tablet (10 mg total) by mouth 2 (two) times daily. 01/25/20   Katha Cabal, DO  meloxicam (MOBIC) 7.5 MG tablet Take 1 tablet (7.5 mg total) by mouth daily for 7 days. 03/18/20 03/25/20  Orvil Feil, PA-C  ondansetron (ZOFRAN) 4 MG tablet Take 1 tablet (4 mg total) by mouth every 8 (eight) hours as needed for up to 5 days for nausea or vomiting. 03/18/20 03/23/20  Orvil Feil, PA-C  promethazine-dextromethorphan (PROMETHAZINE-DM) 6.25-15 MG/5ML syrup Take 5 mLs by mouth 4 (four) times daily as needed. 10/07/18   Eulis Foster, FNP  silver sulfADIAZINE (SILVADENE) 1 % cream Apply 1 application topically 2 (two) times daily. Clean site of wound and apply cream. 03/16/20   Bing Neighbors, FNP    Allergies Patient has no known allergies.  Family History  Problem Relation Age of Onset  . Diabetes Mother   . Hypertension Mother   . Hypertension Father   . Diabetes Father     Social History Social History   Tobacco Use  . Smoking status: Never Smoker  . Smokeless tobacco: Never Used  Vaping Use  . Vaping Use: Never used  Substance  Use Topics  . Alcohol use: No  . Drug use: No      Review of Systems  Constitutional: Patient has fever.  Eyes: No visual changes. No discharge ENT: Patient has congestion.  Cardiovascular: no chest pain. Respiratory: Patient has cough.  Gastrointestinal: No abdominal pain. Patient has nausea. No diarrhea.  Genitourinary: Negative for dysuria. No hematuria Musculoskeletal: Patient has myalgias.  Skin: Negative for rash, abrasions, lacerations, ecchymosis. Neurological: Patient has headache, no focal weakness or  numbness.     ____________________________________________   PHYSICAL EXAM:  VITAL SIGNS: ED Triage Vitals  Enc Vitals Group     BP 03/18/20 0832 102/75     Pulse Rate 03/18/20 0832 (!) 104     Resp 03/18/20 0832 18     Temp 03/18/20 0832 100.1 F (37.8 C)     Temp Source 03/18/20 0832 Oral     SpO2 03/18/20 0832 99 %     Weight --      Height --      Head Circumference --      Peak Flow --      Pain Score 03/18/20 0830 10     Pain Loc --      Pain Edu? --      Excl. in GC? --      Constitutional: Alert and oriented. Patient is lying supine. Eyes: Conjunctivae are normal. PERRL. EOMI. Head: Atraumatic. ENT:      Ears: Tympanic membranes are mildly injected with mild effusion bilaterally.       Nose: No congestion/rhinnorhea.      Mouth/Throat: Mucous membranes are moist. Posterior pharynx is mildly erythematous.  Hematological/Lymphatic/Immunilogical: No cervical lymphadenopathy.  Cardiovascular: Normal rate, regular rhythm. Normal S1 and S2.  Good peripheral circulation. Respiratory: Normal respiratory effort without tachypnea or retractions. Lungs CTAB. Good air entry to the bases with no decreased or absent breath sounds. Gastrointestinal: Bowel sounds 4 quadrants. Soft and nontender to palpation. No guarding or rigidity. No palpable masses. No distention. No CVA tenderness. Musculoskeletal: Full range of motion to all extremities. No gross deformities appreciated. Neurologic:  Normal speech and language. No gross focal neurologic deficits are appreciated.  Skin:  Skin is warm, dry and intact. No rash noted. Psychiatric: Mood and affect are normal. Speech and behavior are normal. Patient exhibits appropriate insight and judgement.   ____________________________________________   LABS (all labs ordered are listed, but only abnormal results are displayed)  Labs Reviewed - No data to  display ____________________________________________  EKG   ____________________________________________  RADIOLOGY   No results found.  ____________________________________________    PROCEDURES  Procedure(s) performed:     Procedures     Medications - No data to display   ____________________________________________   INITIAL IMPRESSION / ASSESSMENT AND PLAN / ED COURSE  Pertinent labs & imaging results that were available during my care of the patient were reviewed by me and considered in my medical decision making (see chart for details).    Assessment and plan COVID-8 24 year old female presents to the emergency department with headache, nonproductive cough, pharyngitis and fever.  She has been symptomatic for the past 2 days.  Her son recently tested positive for COVID-19.  Patient was mildly tachycardic at triage and had low-grade fever but vital signs were otherwise reassuring.  She was resting comfortably on exam table with no increased work of breathing and no adventitious lung sounds were auscultated.  Patient declined COVID-19 testing at urgent care stating that she already knew her family was  positive.  She is requesting medications for supportive care.  She was discharged with Tessalon Perles, low-dose meloxicam, and albuterol inhaler and Zofran.  Rest and hydration were encouraged at home.  A school note was provided and quarantine precautions were given.  All patient questions were answered.   ____________________________________________  FINAL CLINICAL IMPRESSION(S) / ED DIAGNOSES  Final diagnoses:  COVID-19      NEW MEDICATIONS STARTED DURING THIS VISIT:  ED Discharge Orders         Ordered    benzonatate (TESSALON PERLES) 100 MG capsule  3 times daily PRN,   Status:  Discontinued        03/18/20 0857    ondansetron (ZOFRAN) 4 MG tablet  Every 8 hours PRN        03/18/20 0857    albuterol (VENTOLIN HFA) 108 (90 Base) MCG/ACT inhaler   Every 6 hours PRN        03/18/20 0857    meloxicam (MOBIC) 7.5 MG tablet  Daily,   Status:  Discontinued        03/18/20 0857    benzonatate (TESSALON PERLES) 100 MG capsule  3 times daily PRN        03/18/20 0857    meloxicam (MOBIC) 7.5 MG tablet  Daily        03/18/20 0858              This chart was dictated using voice recognition software/Dragon. Despite best efforts to proofread, errors can occur which can change the meaning. Any change was purely unintentional.     Orvil Feil, PA-C 03/18/20 [phone]

## 2020-03-18 NOTE — ED Triage Notes (Signed)
PT reports headache, cough, sore throat, fever.   Son is COVID positive.

## 2020-03-20 ENCOUNTER — Ambulatory Visit (HOSPITAL_COMMUNITY)
Admission: EM | Admit: 2020-03-20 | Discharge: 2020-03-20 | Disposition: A | Discharge status: Home / Self Care | Payer: Medicaid Other | Attending: Family Medicine | Admitting: Family Medicine

## 2020-03-20 ENCOUNTER — Encounter (HOSPITAL_COMMUNITY): Payer: Self-pay

## 2020-03-20 ENCOUNTER — Other Ambulatory Visit: Payer: Self-pay

## 2020-03-20 DIAGNOSIS — R509 Fever, unspecified: Secondary | ICD-10-CM

## 2020-03-20 DIAGNOSIS — U071 COVID-19: Secondary | ICD-10-CM

## 2020-03-20 DIAGNOSIS — R112 Nausea with vomiting, unspecified: Secondary | ICD-10-CM

## 2020-03-20 MED ORDER — MELOXICAM 7.5 MG PO TABS: 7.5000 mg | ORAL_TABLET | Freq: Two times a day (BID) | ORAL | 0 refills | Status: AC | PRN

## 2020-03-20 MED ORDER — ONDANSETRON 8 MG PO TBDP: 8.0000 mg | ORAL_TABLET | Freq: Three times a day (TID) | ORAL | 0 refills | Status: DC | PRN

## 2020-03-20 MED FILL — ONDANSETRON ODT 8 MG TABLET: 8 | 10 days supply | Qty: 30 | Fill #0

## 2020-03-20 NOTE — ED Triage Notes (Signed)
Pt is covid positive as of receiving results yesterday: Pt presents with fatigue, non productive cough, generalized body aches, sore throat, vomiting, and shortness of breath.

## 2020-03-20 NOTE — ED Provider Notes (Signed)
[facility]    CSN: [id number] Arrival date & time: 03/20/20  6606      History   Chief Complaint Chief Complaint  Patient presents with  . Covid +    HPI Brandi Cervantes is a 24 y.o. female.   Here today following up on worsening COVID 19 sxs since visit 2 days prior. Was told yesterday that her COVID test was positive. Was given zofran and meloxicam as well as inhaler and tessalon perles but states the pharmacy did not given them the first two. She has been unable to tolerate PO for about 24 hours now and has been consistently febrile with body aches. Denies significant SOB, wheezing, diarrhea, syncope. Whole household sick with COVID 19 currently.      Past Medical History:  Diagnosis Date  . Hepatitis B    diagnosed in pregnancy  . Hepatitis B     Patient Active Problem List   Diagnosis Date Noted  . Low back pain 10/20/2013  . GERD (gastroesophageal reflux disease) 09/26/2012  . Anemia 09/26/2012  . Language barrier, cultural differences 09/10/2012  . Supervision of other high-risk pregnancy(V23.89) 09/10/2012  . Chronic hepatitis B (HCC) 09/10/2012  . Previous cesarean delivery affecting pregnancy, antepartum 09/07/2012    Past Surgical History:  Procedure Laterality Date  . CESAREAN SECTION      OB History    Gravida  2   Para  2   Term  2   Preterm  0   AB  0   Living  2     SAB  0   TAB  0   Ectopic  0   Multiple  0   Live Births  2            Home Medications    Prior to Admission medications   Medication Sig Start Date End Date Taking? Authorizing Provider  acetaminophen (TYLENOL) 500 MG tablet Take 1 tablet (500 mg total) by mouth every 6 (six) hours as needed. 01/25/20   Brimage, Seward Meth, DO  albuterol (VENTOLIN HFA) 108 (90 Base) MCG/ACT inhaler Inhale 1-2 puffs into the lungs every 6 (six) hours as needed for wheezing or shortness of breath. 03/18/20   Orvil Feil, PA-C  benzonatate (TESSALON PERLES) 100 MG  capsule Take 1 capsule (100 mg total) by mouth 3 (three) times daily as needed for up to 7 days for cough. 03/18/20 03/25/20  Orvil Feil, PA-C  famotidine (PEPCID AC) 10 MG tablet Take 1 tablet (10 mg total) by mouth 2 (two) times daily. 01/25/20   Katha Cabal, DO  meloxicam (MOBIC) 7.5 MG tablet Take 1 tablet (7.5 mg total) by mouth 2 (two) times daily as needed for pain. For body aches and fever 03/20/20 04/19/20  Particia Nearing, PA-C  ondansetron (ZOFRAN ODT) 8 MG disintegrating tablet Take 1 tablet (8 mg total) by mouth every 8 (eight) hours as needed for nausea or vomiting. 03/20/20   Particia Nearing, PA-C  promethazine-dextromethorphan (PROMETHAZINE-DM) 6.25-15 MG/5ML syrup Take 5 mLs by mouth 4 (four) times daily as needed. 10/07/18   Eulis Foster, FNP  silver sulfADIAZINE (SILVADENE) 1 % cream Apply 1 application topically 2 (two) times daily. Clean site of wound and apply cream. 03/16/20   Bing Neighbors, FNP  VEMLIDY 25 MG TABS TAKE 1 TABLET (25 MG TOTAL) BY MOUTH DAILY. 08/28/19   Kuppelweiser, Cassie L, RPH-CPP    Family History Family History  Problem Relation Age of Onset  .  Diabetes Mother   . Hypertension Mother   . Hypertension Father   . Diabetes Father     Social History Social History   Tobacco Use  . Smoking status: Never Smoker  . Smokeless tobacco: Never Used  Vaping Use  . Vaping Use: Never used  Substance Use Topics  . Alcohol use: No  . Drug use: No     Allergies   Patient has no known allergies.   Review of Systems Review of Systems PER HPI    Physical Exam Triage Vital Signs ED Triage Vitals  Enc Vitals Group     BP 03/20/20 0838 105/69     Pulse Rate 03/20/20 0838 (!) 107     Resp 03/20/20 0838 20     Temp 03/20/20 0838 (!) 100.8 F (38.2 C)     Temp Source 03/20/20 0838 Oral     SpO2 03/20/20 0838 96 %     Weight --      Height --      Head Circumference --      Peak Flow --      Pain Score 03/20/20 0836 6      Pain Loc --      Pain Edu? --      Excl. in GC? --    No data found.  Updated Vital Signs BP 105/69 (BP Location: Right Arm)   Pulse (!) 107   Temp (!) 100.8 F (38.2 C) (Oral)   Resp 20   SpO2 96%   Visual Acuity Right Eye Distance:   Left Eye Distance:   Bilateral Distance:    Right Eye Near:   Left Eye Near:    Bilateral Near:     Physical Exam Vitals and nursing note reviewed.  Constitutional:      Appearance: Normal appearance. She is diaphoretic.  HENT:     Head: Atraumatic.     Right Ear: Tympanic membrane normal.     Left Ear: Tympanic membrane normal.     Nose: Rhinorrhea present.     Mouth/Throat:     Mouth: Mucous membranes are moist.     Pharynx: Posterior oropharyngeal erythema present.  Eyes:     Extraocular Movements: Extraocular movements intact.     Conjunctiva/sclera: Conjunctivae normal.  Cardiovascular:     Rate and Rhythm: Normal rate and regular rhythm.     Heart sounds: Normal heart sounds.  Pulmonary:     Effort: Pulmonary effort is normal.     Breath sounds: Normal breath sounds.  Abdominal:     General: Bowel sounds are normal. There is no distension.     Palpations: Abdomen is soft.     Tenderness: There is no abdominal tenderness. There is no right CVA tenderness, left CVA tenderness or guarding.  Musculoskeletal:        General: Normal range of motion.     Cervical back: Normal range of motion and neck supple.  Skin:    General: Skin is warm.  Neurological:     Mental Status: She is alert and oriented to person, place, and time.  Psychiatric:        Mood and Affect: Mood normal.        Thought Content: Thought content normal.        Judgment: Judgment normal.    UC Treatments / Results  Labs (all labs ordered are listed, but only abnormal results are displayed) Labs Reviewed - No data to display  EKG   Radiology No results  found.  Procedures Procedures (including critical care time)  Medications Ordered in  UC Medications - No data to display  Initial Impression / Assessment and Plan / UC Course  I have reviewed the triage vital signs and the nursing notes.  Pertinent labs & imaging results that were available during my care of the patient were reviewed by me and considered in my medical decision making (see chart for details).     Aside from fever, vital signs stable and exam overall reassuring. Will re-send zofran and meloxicam which will help tremendously with her most bothersome sxs, discussed importance of ensuring she can tolerate fluids and bland foods over next few days and to f/u here or ED if still unable. Use albuterol and cough medication prn. Strict return precautions reviewed. Continue isolation protocol.   Final Clinical Impressions(s) / UC Diagnoses   Final diagnoses:  COVID-19  Non-intractable vomiting with nausea, unspecified vomiting type  Fever, unspecified   Discharge Instructions   None    ED Prescriptions    Medication Sig Dispense Auth. Provider   meloxicam (MOBIC) 7.5 MG tablet Take 1 tablet (7.5 mg total) by mouth 2 (two) times daily as needed for pain. For body aches and fever 60 tablet Particia Nearing, PA-C   ondansetron (ZOFRAN ODT) 8 MG disintegrating tablet Take 1 tablet (8 mg total) by mouth every 8 (eight) hours as needed for nausea or vomiting. 30 tablet Particia Nearing, [place]     PDMP not reviewed this encounter.   Particia Nearing, [place] 03/20/20 1001

## 2020-03-28 MED FILL — VEMLIDY 25 MG TABLET: 25 | 30 days supply | Qty: 30 | Fill #9

## 2020-03-29 MED FILL — MELOXICAM 7.5 MG TABLET: 7.5 | 30 days supply | Qty: 60 | Fill #0

## 2020-04-20 MED FILL — VEMLIDY 25 MG TABLET: 25 | 30 days supply | Qty: 30 | Fill #10

## 2020-05-08 ENCOUNTER — Ambulatory Visit: Payer: Medicaid Other

## 2020-05-10 ENCOUNTER — Other Ambulatory Visit (HOSPITAL_BASED_OUTPATIENT_CLINIC_OR_DEPARTMENT_OTHER): Payer: Self-pay | Admitting: Internal Medicine

## 2020-05-10 ENCOUNTER — Ambulatory Visit: Payer: Medicaid Other | Attending: Internal Medicine

## 2020-05-10 DIAGNOSIS — Z23 Encounter for immunization: Secondary | ICD-10-CM

## 2020-05-10 NOTE — Progress Notes (Signed)
° °  Covid-19 Vaccination Clinic  Name:  Brandi Cervantes    MRN: [id number] DOB: 1995/12/04  05/10/2020  [name] was observed post Covid-19 immunization for 15 minutes without incident. She was provided with Vaccine Information Sheet and instruction to access the V-Safe system.   [name] was instructed to call 911 with any severe reactions post vaccine:  Difficulty breathing   Swelling of face and throat   A fast heartbeat   A bad rash all over body   Dizziness and weakness   Immunizations Administered    Name Date Dose VIS Date Route   Pfizer COVID-19 Vaccine 05/10/2020  2:59 PM 0.3 mL 04/17/2020 Intramuscular   Manufacturer: [employer], [employer]   Lot: [id number]   NDC: [address]-3

## 2020-05-13 MED FILL — PFIZER-BIONTECH COVID-19 VA: 30 | 1 days supply | Qty: 0 | Fill #0

## 2020-05-14 MED FILL — VEMLIDY 25 MG TABLET: 25 | 30 days supply | Qty: 30 | Fill #11

## 2020-05-27 ENCOUNTER — Ambulatory Visit: Payer: Medicaid Other

## 2020-05-31 ENCOUNTER — Ambulatory Visit: Payer: Medicaid Other | Attending: Internal Medicine

## 2020-05-31 ENCOUNTER — Other Ambulatory Visit (HOSPITAL_BASED_OUTPATIENT_CLINIC_OR_DEPARTMENT_OTHER): Payer: Self-pay | Admitting: Internal Medicine

## 2020-05-31 DIAGNOSIS — Z23 Encounter for immunization: Secondary | ICD-10-CM

## 2020-05-31 NOTE — Progress Notes (Signed)
° °  Covid-19 Vaccination Clinic  Name:  Brandi Cervantes    MRN: [id number] DOB: 1996/03/19  05/31/2020  Brandi Cervantes was observed post Covid-19 immunization for 15 minutes without incident. She was provided with Vaccine Information Sheet and instruction to access the V-Safe system.   Brandi Cervantes was instructed to call 911 with any severe reactions post vaccine:  Difficulty breathing   Swelling of face and throat   A fast heartbeat   A bad rash all over body   Dizziness and weakness   Immunizations Administered    Name Date Dose VIS Date Route   Pfizer COVID-19 Vaccine 05/31/2020  2:30 PM 0.3 mL 04/17/2020 Intramuscular   Manufacturer: [employer], [employer]   Lot: [id number]   NDC: [address]-3

## 2020-06-04 MED FILL — PFIZER-BIONTECH COVID-19 VA: 30 | 1 days supply | Qty: 0 | Fill #0

## 2020-06-07 ENCOUNTER — Other Ambulatory Visit: Payer: Self-pay | Admitting: Internal Medicine

## 2020-06-07 ENCOUNTER — Other Ambulatory Visit: Payer: Self-pay

## 2020-06-07 DIAGNOSIS — B181 Chronic viral hepatitis B without delta-agent: Secondary | ICD-10-CM

## 2020-06-07 MED ORDER — VEMLIDY 25 MG PO TABS: ORAL_TABLET | ORAL | 1 refills | Status: DC

## 2020-06-07 MED FILL — VEMLIDY 25 MG TABLET: 25 | 30 days supply | Qty: 30 | Fill #0

## 2020-07-01 MED FILL — VEMLIDY 25 MG TABLET: 25 | 30 days supply | Qty: 30 | Fill #1

## 2020-07-04 ENCOUNTER — Other Ambulatory Visit: Payer: Medicaid Other

## 2020-07-04 ENCOUNTER — Other Ambulatory Visit: Payer: Self-pay

## 2020-07-04 DIAGNOSIS — B181 Chronic viral hepatitis B without delta-agent: Secondary | ICD-10-CM

## 2020-07-10 LAB — COMPREHENSIVE METABOLIC PANEL
AG Ratio: 1.4 (calc) (ref 1.0–2.5)
ALT: 9 U/L (ref 6–29)
AST: 13 U/L (ref 10–30)
Albumin: 3.8 g/dL (ref 3.6–5.1)
Alkaline phosphatase (APISO): 72 U/L (ref 31–125)
BUN: 8 mg/dL (ref 7–25)
CO2: 27 mmol/L (ref 20–32)
Calcium: 8.9 mg/dL (ref 8.6–10.2)
Chloride: 107 mmol/L (ref 98–110)
Creat: 0.73 mg/dL (ref 0.50–1.10)
Globulin: 2.8 g/dL (calc) (ref 1.9–3.7)
Glucose, Bld: 83 mg/dL (ref 65–99)
Potassium: 4.3 mmol/L (ref 3.5–5.3)
Sodium: 139 mmol/L (ref 135–146)
Total Bilirubin: 0.3 mg/dL (ref 0.2–1.2)
Total Protein: 6.6 g/dL (ref 6.1–8.1)

## 2020-07-10 LAB — HEPATITIS B DNA, ULTRAQUANTITATIVE, PCR
Hepatitis B DNA (Calc): <1 Log IU/mL
Hepatitis B DNA: <10 IU/mL

## 2020-07-10 LAB — HEPATITIS B E ANTIGEN: Hep B E Ag: NONREACTIVE

## 2020-07-16 ENCOUNTER — Ambulatory Visit: Payer: Medicaid Other | Admitting: Internal Medicine

## 2020-07-17 ENCOUNTER — Ambulatory Visit: Payer: Medicaid Other | Admitting: Internal Medicine

## 2020-07-23 ENCOUNTER — Other Ambulatory Visit: Payer: Self-pay | Admitting: Internal Medicine

## 2020-07-23 DIAGNOSIS — B181 Chronic viral hepatitis B without delta-agent: Secondary | ICD-10-CM

## 2020-07-23 NOTE — Telephone Encounter (Signed)
Has appt 1/26

## 2020-07-24 ENCOUNTER — Encounter: Payer: Self-pay | Admitting: Internal Medicine

## 2020-07-24 ENCOUNTER — Ambulatory Visit (INDEPENDENT_AMBULATORY_CARE_PROVIDER_SITE_OTHER): Payer: Medicaid Other | Admitting: Internal Medicine

## 2020-07-24 ENCOUNTER — Other Ambulatory Visit: Payer: Self-pay | Admitting: Internal Medicine

## 2020-07-24 ENCOUNTER — Other Ambulatory Visit: Payer: Self-pay

## 2020-07-24 DIAGNOSIS — B181 Chronic viral hepatitis B without delta-agent: Secondary | ICD-10-CM | POA: Diagnosis not present

## 2020-07-24 MED ORDER — VEMLIDY 25 MG PO TABS: ORAL_TABLET | ORAL | 11 refills | Status: DC

## 2020-07-24 MED FILL — VEMLIDY 25 MG TABLET: 25 | 30 days supply | Qty: 30 | Fill #0

## 2020-07-24 NOTE — Assessment & Plan Note (Signed)
Her chronic hepatitis B has been under excellent control since starting Vemlidy 3 years ago.  She will continue Vemlidy and follow-up after lab work in 6 months.

## 2020-07-24 NOTE — Progress Notes (Signed)
[facility]  Patient Active Problem List   Diagnosis Date Noted  . Chronic hepatitis B (HCC) 09/10/2012    Priority: High  . Low back pain 10/20/2013  . GERD (gastroesophageal reflux disease) 09/26/2012  . Anemia 09/26/2012  . Language barrier, cultural differences 09/10/2012  . Supervision of other high-risk pregnancy(V23.89) 09/10/2012  . Previous cesarean delivery affecting pregnancy, antepartum 09/07/2012    Patient's Medications  New Prescriptions   No medications on file  Previous Medications   No medications on file  Modified Medications   Modified Medication Previous Medication   TENOFOVIR ALAFENAMIDE FUMARATE (VEMLIDY) 25 MG TABS Tenofovir Alafenamide Fumarate (VEMLIDY) 25 MG TABS      TAKE 1 TABLET (25 MG TOTAL) BY MOUTH DAILY.    TAKE 1 TABLET (25 MG TOTAL) BY MOUTH DAILY.  Discontinued Medications   ACETAMINOPHEN (TYLENOL) 500 MG TABLET    Take 1 tablet (500 mg total) by mouth every 6 (six) hours as needed.   ALBUTEROL (VENTOLIN HFA) 108 (90 BASE) MCG/ACT INHALER    Inhale 1-2 puffs into the lungs every 6 (six) hours as needed for wheezing or shortness of breath.   FAMOTIDINE (PEPCID AC) 10 MG TABLET    Take 1 tablet (10 mg total) by mouth 2 (two) times daily.   ONDANSETRON (ZOFRAN ODT) 8 MG DISINTEGRATING TABLET    Take 1 tablet (8 mg total) by mouth every 8 (eight) hours as needed for nausea or vomiting.   PROMETHAZINE-DEXTROMETHORPHAN (PROMETHAZINE-DM) 6.25-15 MG/5ML SYRUP    Take 5 mLs by mouth 4 (four) times daily as needed.   SILVER SULFADIAZINE (SILVADENE) 1 % CREAM    Apply 1 application topically 2 (two) times daily. Clean site of wound and apply cream.    Subjective: Margaretann is in for her routine hepatitis B follow-up visit.  She denies any problems obtaining, taking or tolerating her Vemlidy and has not missed any doses.  She is not on any other medications.  She developed Covid infection last fall but fortunately all of her  symptoms resolved spontaneously.  Her sense of smell came back.  She had her first 2 doses of the [employer] vaccine in November and December.  Review of Systems: Review of Systems  Constitutional: Negative for fever and weight loss.  Gastrointestinal: Negative for abdominal pain, diarrhea, nausea and vomiting.  Genitourinary:       She has noted some irregular periods since her IUD was placed 5 years ago.  Neurological: Positive for headaches.       She has mild, chronic headaches that are relieved by wearing a hat.    Past Medical History:  Diagnosis Date  . Hepatitis B    diagnosed in pregnancy  . Hepatitis B     Social History   Tobacco Use  . Smoking status: Never Smoker  . Smokeless tobacco: Never Used  Vaping Use  . Vaping Use: Never used  Substance Use Topics  . Alcohol use: No  . Drug use: No    Family History  Problem Relation Age of Onset  . Diabetes Mother   . Hypertension Mother   . Hypertension Father   . Diabetes Father     No Known Allergies  Objective: Vitals:   07/24/20 1034  BP: 110/75  Pulse: 78  Temp: 97.6 F (36.4 C)  TempSrc: Oral  SpO2: 100%  Weight: 180 lb (81.6 kg)  Height: 5\' 1"  (1.549 m)   Body mass  index is 34.01 kg/m.  Physical Exam Constitutional:      Comments: She is in good spirits as usual.  Abdominal:     Palpations: Abdomen is soft.     Tenderness: There is no abdominal tenderness.  Psychiatric:        Mood and Affect: Mood normal.     Lab Results CMP     Component Value Date/Time   NA 139 07/04/2020 1336   NA 142 01/25/2020 1249   K 4.3 07/04/2020 1336   CL 107 07/04/2020 1336   CO2 27 07/04/2020 1336   GLUCOSE 83 07/04/2020 1336   BUN 8 07/04/2020 1336   BUN 10 01/25/2020 1249   CREATININE 0.73 07/04/2020 1336   CALCIUM 8.9 07/04/2020 1336   PROT 6.6 07/04/2020 1336   PROT 6.7 01/25/2020 1249   ALBUMIN 4.4 01/25/2020 1249   AST 13 07/04/2020 1336   ALT 9 07/04/2020 1336   ALKPHOS 105  01/25/2020 1249   BILITOT 0.3 07/04/2020 1336   BILITOT 0.3 01/25/2020 1249   GFRNONAA 109 01/25/2020 1249   GFRNONAA >89 03/06/2014 1407   GFRAA 126 01/25/2020 1249   GFRAA >89 03/06/2014 1407   Hepatitis B E antigen 07/04/2020: Negative Hepatitis B DNA viral load 07/04/2020: Less than 10   Problem List Items Addressed This Visit      High   Chronic hepatitis B (HCC)    Her chronic hepatitis B has been under excellent control since starting Vemlidy 3 years ago.  She will continue Vemlidy and follow-up after lab work in 6 months.      Relevant Medications   Tenofovir Alafenamide Fumarate (VEMLIDY) 25 MG TABS   Other Relevant Orders   Hepatitis B e antigen   Hepatitis B DNA, ultraquantitative, PCR   Comprehensive metabolic panel       Cliffton Asters, MD [facility] [phone] pager   [phone] cell 07/24/2020, 10:57 AM

## 2020-08-16 MED FILL — VEMLIDY 25 MG TABLET: 25 | 30 days supply | Qty: 30 | Fill #1

## 2020-09-24 ENCOUNTER — Other Ambulatory Visit (HOSPITAL_COMMUNITY): Payer: Self-pay

## 2020-10-03 ENCOUNTER — Other Ambulatory Visit (HOSPITAL_COMMUNITY): Payer: Self-pay

## 2020-10-03 ENCOUNTER — Ambulatory Visit (INDEPENDENT_AMBULATORY_CARE_PROVIDER_SITE_OTHER): Payer: Medicaid Other | Admitting: Family Medicine

## 2020-10-03 ENCOUNTER — Encounter: Payer: Self-pay | Admitting: Family Medicine

## 2020-10-03 ENCOUNTER — Other Ambulatory Visit: Payer: Self-pay

## 2020-10-03 DIAGNOSIS — Z6834 Body mass index (BMI) 34.0-34.9, adult: Secondary | ICD-10-CM | POA: Diagnosis not present

## 2020-10-03 DIAGNOSIS — R519 Headache, unspecified: Secondary | ICD-10-CM | POA: Insufficient documentation

## 2020-10-03 DIAGNOSIS — G4440 Drug-induced headache, not elsewhere classified, not intractable: Secondary | ICD-10-CM | POA: Diagnosis not present

## 2020-10-03 MED FILL — Tenofovir Alafenamide Fumarate Tab 25 MG: ORAL | 30 days supply | Qty: 30 | Fill #0 | Status: AC

## 2020-10-03 NOTE — Patient Instructions (Signed)
It was great to see you today.  Here is a quick review of the things we talked about:   Hepatitis: I do not need to do any more lab work today because you have had recent lab work done looks good.  Headaches: I am sorry to hear that you have had these frequent headaches.  I agree with you, I think this is likely due to hepatitis medication.  I recommend that you continue to talk to your infectious disease doctor about it.  Weight: I am sorry we were not able to have a longer discussion about your weight today.  I be happy to see you again in clinic at your convenience for a more in-depth conversation.  Contraception: Your IUD is a [employer].  It is good for a total of 6 years.  It should be removed before 11/2022.

## 2020-10-03 NOTE — Assessment & Plan Note (Signed)
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  No obvious cause of distress, muscle tightness, hair pulling.  No obvious mechanical reason for these headaches.  I considered medication overuse headache although she does not seem to have gone through a phase where she used medication daily for these headaches because she is not someone who likes to rely on medication.  I am suspicious that this is related to her tenofovir.  Headaches do seem to be a common side effect but do not appear, occurring in as many as 12% of patients.  She was encouraged to continue talking with her infectious disease doctor about the best way to combat these headaches.

## 2020-10-03 NOTE — Assessment & Plan Note (Signed)
Chart review shows that she has had roughly 40 pound weight gain in the past 2 years.  She is interested in gaining more control over her weight.  We did not have time to fully discuss this topic and she was encouraged to return to clinic if she would like to learn more about appropriate methods for weight loss.  She was also encouraged to return to clinic for an A1c blood draw and a lipid panel.

## 2020-10-03 NOTE — Progress Notes (Signed)
SUBJECTIVE:   CHIEF COMPLAINT / HPI:   Brandi Cervantes presents to clinic today for her annual physical exam.  Pap smear: Up-to-date  Sexually active: Yes 1 partner in the past year.  Currently has an IUD (Liletta placed in 2018) for birth control.  Interested in permanent contraception.  Safe at home: Yes.  Smoking history: Denies  Alcohol use: Denies  Illicit drug use: Denies  Chronic hepatitis B Currently medicated with tenofovir.  She has regular follow-up with infectious disease.  She has no plans of becoming pregnant.  Headache She reports daily headaches that have been ongoing for several years.  She is suspicious that they may be related to her chronic hepatitis B medication.  She describes these headaches as a dull ache over both sides of the front of her head.  They happen almost daily and last for minutes to hours.  She denies vision changes, sensitivity to light, sensitivity to noise.  The headache is not throbbing or pulsating.  She does not associated with an aura.  She denies nausea or vomiting.  They seem to be most improved by sleep.  She is tried taking Motrin and Tylenol for these headaches does not seem significant improvement, caffeine does not help keep.  Though she has previously taken Motrin and Tylenol for his headaches, she has not taken anything for these headaches in the past month because medications do not seem to work.  PERTINENT  PMH / PSH: Chronic hepatitis B, BMI of 33  OBJECTIVE:   BP 100/60   Pulse 85   Ht 5\' 1"  (1.549 m)   Wt 180 lb (81.6 kg)   SpO2 99%   BMI 34.01 kg/m    General: Alert and cooperative and appears to be in no acute distress HEENT: Extraocular muscles intact, pupils equal, round and reactive to light and accommodation.  Oropharynx normal.  Normal TMs visualized bilaterally.  Moist mucous membranes.  No significant muscle tension of the trapezius muscles or neck muscles.  She does not wear her hair in a tight style. Cardio:  Normal S1 and S2, no S3 or S4. Rhythm is regular. No murmurs or rubs.   Pulm: Clear to auscultation bilaterally, no crackles, wheezing, or diminished breath sounds. Normal respiratory effort Abdomen: Bowel sounds normal. Abdomen soft and non-tender.  Extremities: No peripheral edema. Warm/ well perfused.  Strong radial pulses. Neuro: Cranial nerves grossly intact  ASSESSMENT/PLAN:   Headache History is most consistent with a cluster type headache.  No obvious cause of distress, muscle tightness, hair pulling.  No obvious mechanical reason for these headaches.  I considered medication overuse headache although she does not seem to have gone through a phase where she used medication daily for these headaches because she is not someone who likes to rely on medication.  I am suspicious that this is related to her tenofovir.  Headaches do seem to be a common side effect but do not appear, occurring in as many as 12% of patients.  She was encouraged to continue talking with her infectious disease doctor about the best way to combat these headaches.  BMI 34.0-34.9,adult Chart review shows that she has had roughly 40 pound weight gain in the past 2 years.  She is interested in gaining more control over her weight.  We did not have time to fully discuss this topic and she was encouraged to return to clinic if she would like to learn more about appropriate methods for weight loss.  She was  also encouraged to return to clinic for an A1c blood draw and a lipid panel.   Annual Examination Female  under 21 yo  I reviewed the following patient responses on our Physical Exam Form Tobacco use  Alcohol Use  Weight  Exercise  Risk for STI  Increased family cancer risk Violence risk  PHQ9 score reviewed  Blood pressure reviewed  I considered the following items based upon USPSTF recommendations: HIV testing:  Hepatitis C testing Cholesterol screening STI screening if high risk (Hepatitis B, Syphilis,  Gonorrhea, Chlamydia) Cervical Cancer if female at birth Immunizations - Influenza, Covid, [place], Pneumonia, Tetanus  See After Visit Summary for recommendations     Mirian Mo, MD [facility]

## 2020-10-07 ENCOUNTER — Other Ambulatory Visit: Payer: Medicaid Other

## 2020-10-08 ENCOUNTER — Other Ambulatory Visit: Payer: Self-pay

## 2020-10-08 ENCOUNTER — Other Ambulatory Visit: Payer: Medicaid Other

## 2020-10-08 DIAGNOSIS — Z6834 Body mass index (BMI) 34.0-34.9, adult: Secondary | ICD-10-CM

## 2020-10-08 DIAGNOSIS — Z131 Encounter for screening for diabetes mellitus: Secondary | ICD-10-CM | POA: Diagnosis not present

## 2020-10-08 DIAGNOSIS — Z13220 Encounter for screening for lipoid disorders: Secondary | ICD-10-CM | POA: Diagnosis not present

## 2020-10-09 LAB — LIPID PANEL
Chol/HDL Ratio: 3.1 ratio (ref 0.0–4.4)
Cholesterol, Total: 150 mg/dL (ref 100–199)
HDL: 49 mg/dL (ref >39)
LDL Chol Calc (NIH): 88 mg/dL (ref 0–99)
Triglycerides: 67 mg/dL (ref 0–149)
VLDL Cholesterol Cal: 13 mg/dL (ref 5–40)

## 2020-10-09 LAB — HEMOGLOBIN A1C
Est. average glucose Bld gHb Est-mCnc: 111 mg/dL
Hgb A1c MFr Bld: 5.5 % (ref 4.8–5.6)

## 2020-10-23 ENCOUNTER — Ambulatory Visit: Payer: Medicaid Other | Admitting: Family Medicine

## 2020-10-25 ENCOUNTER — Other Ambulatory Visit (HOSPITAL_COMMUNITY): Payer: Self-pay

## 2020-10-25 MED FILL — Tenofovir Alafenamide Fumarate Tab 25 MG: ORAL | 30 days supply | Qty: 30 | Fill #1 | Status: AC

## 2020-10-26 ENCOUNTER — Other Ambulatory Visit (HOSPITAL_COMMUNITY): Payer: Self-pay

## 2020-10-28 ENCOUNTER — Ambulatory Visit: Payer: Medicaid Other | Admitting: Student

## 2020-11-14 ENCOUNTER — Ambulatory Visit: Payer: Medicaid Other | Admitting: Student

## 2020-11-14 ENCOUNTER — Other Ambulatory Visit: Payer: Self-pay

## 2020-11-14 VITALS — BP 104/72 | HR 92 | Wt 186.1 lb

## 2020-11-14 DIAGNOSIS — T8332XA Displacement of intrauterine contraceptive device, initial encounter: Secondary | ICD-10-CM

## 2020-11-14 NOTE — Progress Notes (Signed)
     GYNECOLOGY OFFICE PROCEDURE NOTE  Brandi Cervantes is a 25 y.o. [id number] here for Liletta IUD removal. No GYN concerns.  Last pap smear was on  01/24/2021 and was normal.  IUD Removal  Patient identified, informed consent performed, consent signed.  Patient was in the dorsal lithotomy position, normal external genitalia was noted.  A speculum was placed in the patient's vagina, normal discharge was noted, no lesions. The cervix was visualized, no lesions, no abnormal discharge.  The strings of the IUD were not visualized, so Kelly forceps were introduced into the endometrial cavity and the IUD was not able to be grasped.  Patient tolerated the procedure well.       Assessment & Plan:  1. Intrauterine contraceptive device threads lost, initial encounter -unable to remove IUD; will schedule for pelvic US and then patient will return for appt with MD for removal - US Pelvis Complete; Future  Approximately 20 minutes of total time was spent with this patient on counseling and coordination of care  Marylene Land, CNM 11/14/2020 4:28 PM

## 2020-11-18 ENCOUNTER — Other Ambulatory Visit (HOSPITAL_COMMUNITY): Payer: Self-pay

## 2020-11-18 MED FILL — Tenofovir Alafenamide Fumarate Tab 25 MG: ORAL | 30 days supply | Qty: 30 | Fill #2 | Status: AC

## 2020-11-27 ENCOUNTER — Ambulatory Visit (HOSPITAL_COMMUNITY)
Admission: RE | Admit: 2020-11-27 | Discharge: 2020-11-27 | Disposition: A | Discharge status: Home / Self Care | Payer: Medicaid Other | Source: Ambulatory Visit | Attending: Student | Admitting: Student

## 2020-11-27 ENCOUNTER — Other Ambulatory Visit: Payer: Self-pay

## 2020-11-27 DIAGNOSIS — T8332XA Displacement of intrauterine contraceptive device, initial encounter: Secondary | ICD-10-CM | POA: Diagnosis present

## 2020-11-27 DIAGNOSIS — Z975 Presence of (intrauterine) contraceptive device: Secondary | ICD-10-CM | POA: Diagnosis not present

## 2020-12-03 ENCOUNTER — Ambulatory Visit: Payer: Medicaid Other | Admitting: Obstetrics and Gynecology

## 2020-12-03 ENCOUNTER — Ambulatory Visit: Payer: Medicaid Other | Admitting: Family Medicine

## 2020-12-03 ENCOUNTER — Other Ambulatory Visit: Payer: Self-pay

## 2020-12-09 ENCOUNTER — Other Ambulatory Visit: Payer: Self-pay

## 2020-12-09 ENCOUNTER — Other Ambulatory Visit: Payer: Medicaid Other

## 2020-12-09 DIAGNOSIS — B181 Chronic viral hepatitis B without delta-agent: Secondary | ICD-10-CM | POA: Diagnosis not present

## 2020-12-11 ENCOUNTER — Other Ambulatory Visit (HOSPITAL_COMMUNITY): Payer: Self-pay

## 2020-12-11 LAB — COMPREHENSIVE METABOLIC PANEL
AG Ratio: 1.5 (calc) (ref 1.0–2.5)
ALT: 11 U/L (ref 6–29)
AST: 16 U/L (ref 10–30)
Albumin: 4.3 g/dL (ref 3.6–5.1)
Alkaline phosphatase (APISO): 90 U/L (ref 31–125)
BUN: 11 mg/dL (ref 7–25)
CO2: 27 mmol/L (ref 20–32)
Calcium: 9.4 mg/dL (ref 8.6–10.2)
Chloride: 105 mmol/L (ref 98–110)
Creat: 0.7 mg/dL (ref 0.50–1.10)
Globulin: 2.8 g/dL (calc) (ref 1.9–3.7)
Glucose, Bld: 88 mg/dL (ref 65–99)
Potassium: 4.3 mmol/L (ref 3.5–5.3)
Sodium: 139 mmol/L (ref 135–146)
Total Bilirubin: 0.4 mg/dL (ref 0.2–1.2)
Total Protein: 7.1 g/dL (ref 6.1–8.1)

## 2020-12-11 LAB — HEPATITIS B DNA, ULTRAQUANTITATIVE, PCR
Hepatitis B DNA (Calc): <1 Log IU/mL
Hepatitis B DNA: <10 IU/mL

## 2020-12-11 LAB — HEPATITIS B E ANTIGEN: Hep B E Ag: NONREACTIVE

## 2020-12-11 MED FILL — Tenofovir Alafenamide Fumarate Tab 25 MG: ORAL | 30 days supply | Qty: 30 | Fill #3 | Status: AC

## 2020-12-16 ENCOUNTER — Encounter: Payer: Self-pay | Admitting: Family Medicine

## 2020-12-16 ENCOUNTER — Other Ambulatory Visit: Payer: Self-pay

## 2020-12-16 ENCOUNTER — Ambulatory Visit (INDEPENDENT_AMBULATORY_CARE_PROVIDER_SITE_OTHER): Payer: Medicaid Other | Admitting: Family Medicine

## 2020-12-16 VITALS — BP 112/72 | HR 64 | Wt 181.8 lb

## 2020-12-16 DIAGNOSIS — Z3202 Encounter for pregnancy test, result negative: Secondary | ICD-10-CM | POA: Diagnosis not present

## 2020-12-16 DIAGNOSIS — Z538 Procedure and treatment not carried out for other reasons: Secondary | ICD-10-CM

## 2020-12-16 DIAGNOSIS — Z975 Presence of (intrauterine) contraceptive device: Secondary | ICD-10-CM | POA: Diagnosis not present

## 2020-12-16 LAB — POCT PREGNANCY, URINE: Preg Test, Ur: NEGATIVE

## 2020-12-16 NOTE — Progress Notes (Signed)
   Subjective:    Patient ID: Brandi Cervantes is a 24 y.o. female presenting with IUD Removal  on 12/16/2020  HPI: Here for IUD removal. ALready has had one attempt at removal. She does not have visible strings. She has an u/s which reveals IUD to be intrauterine.  Review of Systems  Constitutional:  Negative for chills and fever.  Respiratory:  Negative for shortness of breath.   Cardiovascular:  Negative for chest pain.  Gastrointestinal:  Negative for abdominal pain, nausea and vomiting.  Genitourinary:  Negative for dysuria.  Skin:  Negative for rash.     Objective:    BP 112/72   Pulse 64   Wt 181 lb 12.8 oz (82.5 kg)   BMI 34.35 kg/m  Physical Exam Constitutional:      General: She is not in acute distress.    Appearance: She is well-developed.  HENT:     Head: Normocephalic and atraumatic.  Eyes:     General: No scleral icterus. Cardiovascular:     Rate and Rhythm: Normal rate.  Pulmonary:     Effort: Pulmonary effort is normal.  Abdominal:     Palpations: Abdomen is soft.  Musculoskeletal:     Cervical back: Neck supple.  Skin:    General: Skin is warm and dry.  Neurological:     Mental Status: She is alert and oriented to person, place, and time.   Procedure: Speculum placed inside vagina.  Cervix visualized.  Cervix grasped with single tooth tenaculum. Os finder needed to dilate cervix. IUD hook and uterine dressing forceps passed x 5 with no success at getting IUD. Patient was quite uncomfortable and tearful. Further attempts abandoned.      Assessment & Plan:  Attempted IUD removal, unsuccessful - too painful in office--will book for IUD removal and re-insertion in OR  Risks include but are not limited to bleeding, infection, injury to surrounding structures, including bowel, bladder and ureters, blood clots, and death.  Likelihood of success is high.  Return in about 2 months (around 02/15/2021) for postop check.  [name] 12/16/2020 11:19  AM

## 2020-12-17 ENCOUNTER — Telehealth: Payer: Self-pay | Admitting: *Deleted

## 2020-12-17 NOTE — Telephone Encounter (Signed)
Call to patient regarding surgery date- scheduled for 02-11-21. Patient and husband on line together. Decline 8-16 date as they are moving. Request first available date with any female physician. Advised will review with physician and call back.

## 2020-12-23 ENCOUNTER — Encounter: Payer: Self-pay | Admitting: *Deleted

## 2020-12-23 NOTE — Telephone Encounter (Signed)
Call to patient. Advised can move surgery up to 01-09-21. Patient very appreciative of date change. Advised 01-09-21 at [facility] at 1330. Arrive 1130. Will receive letter in mail and pre-op instruction call.  Encounter closed.

## 2020-12-25 ENCOUNTER — Ambulatory Visit (INDEPENDENT_AMBULATORY_CARE_PROVIDER_SITE_OTHER): Payer: Medicaid Other | Admitting: Internal Medicine

## 2020-12-25 ENCOUNTER — Other Ambulatory Visit: Payer: Self-pay

## 2020-12-25 ENCOUNTER — Encounter: Payer: Self-pay | Admitting: Internal Medicine

## 2020-12-25 DIAGNOSIS — B181 Chronic viral hepatitis B without delta-agent: Secondary | ICD-10-CM

## 2020-12-25 NOTE — Progress Notes (Signed)
[facility]  Patient Active Problem List   Diagnosis Date Noted   Chronic hepatitis B (HCC) 09/10/2012    Priority: High   Headache 10/03/2020   BMI 34.0-34.9,adult 10/03/2020   DUB (dysfunctional uterine bleeding) 11/13/2016   Low back pain 10/20/2013   GERD (gastroesophageal reflux disease) 09/26/2012   Anemia 09/26/2012   Language barrier, cultural differences 09/10/2012   Supervision of other high-risk pregnancy(V23.89) 09/10/2012   Previous cesarean delivery affecting pregnancy, antepartum 09/07/2012    Patient's Medications  New Prescriptions   No medications on file  Previous Medications   TENOFOVIR ALAFENAMIDE FUMARATE 25 MG TABS    TAKE 1 TABLET BY MOUTH DAILY.  Modified Medications   No medications on file  Discontinued Medications   COVID-19 MRNA VACCINE, PFIZER, 30 MCG/0.3ML INJECTION    INJECT AS DIRECTED   COVID-19 MRNA VACCINE, PFIZER, 30 MCG/0.3ML INJECTION    INJECT AS DIRECTED    Subjective: Brandi Cervantes is in for her routine hepatitis B follow-up visit.  She started on Vemlidy in August 2019 and has had complete viral suppression since that time.  She remains e antigen negative with normal liver enzymes.  She has had no problems tolerating Vemlidy.  She continues to be bothered by fatigue, headache and dizziness.  She says that the symptoms are there every day.  She has been feeling depressed because she and her husband do not have any friends or family locally.  They have decided to move back to the [place] in about 3 months.  Review of Systems: Review of Systems  Constitutional:  Positive for malaise/fatigue.  Gastrointestinal:  Negative for abdominal pain, diarrhea, nausea and vomiting.  Neurological:  Positive for dizziness and headaches.   Past Medical History:  Diagnosis Date   Hepatitis B    diagnosed in pregnancy   Hepatitis B     Social History   Tobacco Use   Smoking status: Never   Smokeless tobacco:  Never  Vaping Use   Vaping Use: Never used  Substance Use Topics   Alcohol use: No   Drug use: No    Family History  Problem Relation Age of Onset   Diabetes Mother    Hypertension Mother    Hypertension Father    Diabetes Father     No Known Allergies  Objective: Vitals:   12/25/20 0859  BP: 112/81  Pulse: 100  Temp: 97.7 F (36.5 C)  TempSrc: Oral  SpO2: 97%  Weight: 182 lb 2 oz (82.6 kg)   Body mass index is 34.41 kg/m.  Physical Exam Constitutional:      Appearance: Normal appearance.  Cardiovascular:     Rate and Rhythm: Normal rate and regular rhythm.  Pulmonary:     Effort: Pulmonary effort is normal.     Breath sounds: Normal breath sounds.  Abdominal:     Palpations: Abdomen is soft. There is no mass.     Tenderness: There is no abdominal tenderness.  Psychiatric:        Mood and Affect: Mood normal.    Lab Results 12/09/2020 Liver enzymes normal Hepatitis B eAg remains negative Hepatitis B viral load less than 10    Problem List Items Addressed This Visit       High   Chronic hepatitis B (HCC)    She has had an excellent response to [facility] therapy and her viral load remains undetectable.  I will recheck her hepatitis B surface antigen  today and see her back in 6 weeks to help determine if she should stay on Vemlidy.  She will need to get a new provider when she moves in several months.         Relevant Orders   Hepatitis B Surface AntiGEN   Hepatitis B surface antibody,qualitative     Cliffton Asters, MD [facility] [phone] pager   [phone] cell 12/25/2020, 9:17 AM

## 2020-12-25 NOTE — Assessment & Plan Note (Signed)
She has had an excellent response to [facility] therapy and her viral load remains undetectable.  I will recheck her hepatitis B surface antigen today and see her back in 6 weeks to help determine if she should stay on Vemlidy.  She will need to get a new provider when she moves in several months.

## 2020-12-26 LAB — HEPATITIS B SURFACE ANTIBODY,QUALITATIVE: Hep B S Ab: NONREACTIVE

## 2020-12-26 LAB — HEPATITIS B SURFACE ANTIGEN: Hepatitis B Surface Ag: REACTIVE — AB

## 2021-01-03 ENCOUNTER — Other Ambulatory Visit (HOSPITAL_COMMUNITY): Payer: Self-pay

## 2021-01-03 MED FILL — Tenofovir Alafenamide Fumarate Tab 25 MG: ORAL | 30 days supply | Qty: 30 | Fill #4 | Status: AC

## 2021-01-06 ENCOUNTER — Encounter (HOSPITAL_BASED_OUTPATIENT_CLINIC_OR_DEPARTMENT_OTHER): Payer: Self-pay | Admitting: Family Medicine

## 2021-01-06 ENCOUNTER — Other Ambulatory Visit: Payer: Self-pay

## 2021-01-06 NOTE — Progress Notes (Addendum)
Spoke w/ via phone for pre-op interview---pt Lab needs dos----cbc urine preg COVID test -----patient states asymptomatic no test needed Arrive at -------1130 am 01-09-2021 NPO after MN NO Solid Food.  Clear liquids from MN until---1030 am then npo Med rec completed Medications to take morning of surgery -----none Diabetic medication -----n/a Patient instructed no nail polish to be worn day of surgery Patient instructed to bring photo id and insurance card day of surgery Patient aware to have Driver (ride ) / caregiver  spouse ahmed   for 24 hours after surgery  Patient Special Instructions -----none Pre-Op special Istructions -----none Patient verbalized understanding of instructions that were given at this phone interview. Patient denies shortness of breath, chest pain, fever, cough at this phone interview.   Pt states no interpreter needed speaks english well

## 2021-01-08 ENCOUNTER — Other Ambulatory Visit: Payer: Medicaid Other

## 2021-01-08 DIAGNOSIS — T8339XA Other mechanical complication of intrauterine contraceptive device, initial encounter: Secondary | ICD-10-CM

## 2021-01-08 NOTE — H&P (Signed)
Brandi Cervantes is an 25 y.o. [id number] female.   Chief Complaint: retained IUD HPI: Has retained IUD,unable to retrieve in the office. Desires removal and replacement.  Past Medical History:  Diagnosis Date   COVID 02/2020   fever tired sleepy x 1 week all symptoms resolved   Hepatitis B    diagnosed in pregnancy    Past Surgical History:  Procedure Laterality Date   CESAREAN SECTION  2012    Family History  Problem Relation Age of Onset   Diabetes Mother    Hypertension Mother    Hypertension Father    Diabetes Father    Social History:  reports that she has never smoked. She has never used smokeless tobacco. She reports that she does not drink alcohol and does not use drugs.  Allergies: No Known Allergies  No medications prior to admission.    A comprehensive review of systems was negative.  Height 5\' 1"  (1.549 m), weight 82.6 kg, last menstrual period 12/30/2020. General appearance: alert, cooperative, and appears stated age Head: Normocephalic, without obvious abnormality, atraumatic Neck: no adenopathy, supple, symmetrical, trachea midline, and thyroid not enlarged, symmetric, no tenderness/mass/nodules Lungs: clear to auscultation bilaterally Heart: regular rate and rhythm Abdomen: soft, non-tender; bowel sounds normal; no masses,  no organomegaly Extremities: extremities normal, atraumatic, no cyanosis or edema Skin: Skin color, texture, turgor normal. No rashes or lesions Neurologic: Grossly normal   Lab Results  Component Value Date   WBC 6.3 01/25/2020   HGB 13.5 01/25/2020   HCT 40.2 01/25/2020   MCV 87 01/25/2020   PLT 254 01/25/2020   Lab Results  Component Value Date   PREGTESTUR NEGATIVE 12/16/2020     Assessment/Plan Principal Problem:   Retained intrauterine contraceptive device (IUD)  For D & C with removal and re-insertion. Risks include but are not limited to bleeding, infection, injury to surrounding structures, including bowel, bladder  and ureters, blood clots, and death.  Likelihood of success is high.    12/18/2020 01/08/2021, 4:00 PM

## 2021-01-08 NOTE — Progress Notes (Signed)
Sent inbox message to Dr Shawnie Pons in epic requested pre-op order for IUD per pharmacy for surgery scheduled 01-09-2021.

## 2021-01-09 ENCOUNTER — Ambulatory Visit (HOSPITAL_BASED_OUTPATIENT_CLINIC_OR_DEPARTMENT_OTHER): Payer: Medicaid Other | Admitting: Anesthesiology

## 2021-01-09 ENCOUNTER — Other Ambulatory Visit (HOSPITAL_COMMUNITY): Payer: Self-pay

## 2021-01-09 ENCOUNTER — Encounter (HOSPITAL_BASED_OUTPATIENT_CLINIC_OR_DEPARTMENT_OTHER)
Admission: RE | Disposition: A | Discharge status: Home / Self Care | Payer: Self-pay | Source: Home / Self Care | Attending: Family Medicine

## 2021-01-09 ENCOUNTER — Ambulatory Visit (HOSPITAL_BASED_OUTPATIENT_CLINIC_OR_DEPARTMENT_OTHER)
Admission: RE | Admit: 2021-01-09 | Discharge: 2021-01-09 | Disposition: A | Discharge status: Home / Self Care | Payer: Medicaid Other | Attending: Family Medicine | Admitting: Family Medicine

## 2021-01-09 ENCOUNTER — Encounter (HOSPITAL_BASED_OUTPATIENT_CLINIC_OR_DEPARTMENT_OTHER): Payer: Self-pay | Admitting: Family Medicine

## 2021-01-09 DIAGNOSIS — Z8616 Personal history of COVID-19: Secondary | ICD-10-CM | POA: Insufficient documentation

## 2021-01-09 DIAGNOSIS — Z30431 Encounter for routine checking of intrauterine contraceptive device: Secondary | ICD-10-CM | POA: Insufficient documentation

## 2021-01-09 DIAGNOSIS — T8339XA Other mechanical complication of intrauterine contraceptive device, initial encounter: Secondary | ICD-10-CM

## 2021-01-09 DIAGNOSIS — Z30433 Encounter for removal and reinsertion of intrauterine contraceptive device: Secondary | ICD-10-CM

## 2021-01-09 DIAGNOSIS — Z8619 Personal history of other infectious and parasitic diseases: Secondary | ICD-10-CM | POA: Diagnosis not present

## 2021-01-09 DIAGNOSIS — Z98891 History of uterine scar from previous surgery: Secondary | ICD-10-CM | POA: Diagnosis not present

## 2021-01-09 DIAGNOSIS — B181 Chronic viral hepatitis B without delta-agent: Secondary | ICD-10-CM

## 2021-01-09 DIAGNOSIS — K219 Gastro-esophageal reflux disease without esophagitis: Secondary | ICD-10-CM | POA: Diagnosis not present

## 2021-01-09 DIAGNOSIS — D649 Anemia, unspecified: Secondary | ICD-10-CM | POA: Diagnosis not present

## 2021-01-09 HISTORY — PX: IUD REMOVAL: SHX5392

## 2021-01-09 HISTORY — PX: INTRAUTERINE DEVICE (IUD) INSERTION: SHX5877

## 2021-01-09 LAB — CBC
HCT: 42 % (ref 36.0–46.0)
Hemoglobin: 13.6 g/dL (ref 12.0–15.0)
MCH: 28.3 pg (ref 26.0–34.0)
MCHC: 32.4 g/dL (ref 30.0–36.0)
MCV: 87.3 fL (ref 80.0–100.0)
Platelets: 268 10*3/uL (ref 150–400)
RBC: 4.81 MIL/uL (ref 3.87–5.11)
RDW: 13 % (ref 11.5–15.5)
WBC: 5.2 10*3/uL (ref 4.0–10.5)
nRBC: 0 % (ref 0.0–0.2)

## 2021-01-09 LAB — POCT PREGNANCY, URINE: Preg Test, Ur: NEGATIVE

## 2021-01-09 SURGERY — REMOVAL, INTRAUTERINE DEVICE
Anesthesia: General | Site: Uterus

## 2021-01-09 MED ORDER — ACETAMINOPHEN 500 MG PO TABS: 1000.0000 mg | ORAL_TABLET | ORAL | Status: DC

## 2021-01-09 MED ORDER — FENTANYL CITRATE (PF) 100 MCG/2ML IJ SOLN
INTRAMUSCULAR | Status: AC
  Filled 2021-01-09: qty 2

## 2021-01-09 MED ORDER — LIDOCAINE 2% (20 MG/ML) 5 ML SYRINGE
INTRAMUSCULAR | Status: DC | PRN
  Administered 2021-01-09: 80 mg via INTRAVENOUS

## 2021-01-09 MED ORDER — MIDAZOLAM HCL 2 MG/2ML IJ SOLN
INTRAMUSCULAR | Status: DC | PRN
  Administered 2021-01-09: 2 mg via INTRAVENOUS

## 2021-01-09 MED ORDER — LEVONORGESTREL 20.1 MCG/DAY IU IUD
1.0000 | INTRAUTERINE_SYSTEM | INTRAUTERINE | Status: AC
Start: 2021-01-09 — End: 2021-01-09
  Administered 2021-01-09: 1 via INTRAUTERINE

## 2021-01-09 MED ORDER — MIDAZOLAM HCL 2 MG/2ML IJ SOLN
INTRAMUSCULAR | Status: AC
  Filled 2021-01-09: qty 2

## 2021-01-09 MED ORDER — ACETAMINOPHEN 500 MG PO TABS
ORAL_TABLET | ORAL | Status: AC
  Filled 2021-01-09: qty 2

## 2021-01-09 MED ORDER — PHENYLEPHRINE 40 MCG/ML (10ML) SYRINGE FOR IV PUSH (FOR BLOOD PRESSURE SUPPORT)
PREFILLED_SYRINGE | INTRAVENOUS | Status: DC | PRN
  Administered 2021-01-09: 80 ug via INTRAVENOUS

## 2021-01-09 MED ORDER — PROPOFOL 10 MG/ML IV BOLUS
INTRAVENOUS | Status: AC
  Filled 2021-01-09: qty 20

## 2021-01-09 MED ORDER — ONDANSETRON HCL 4 MG/2ML IJ SOLN
INTRAMUSCULAR | Status: AC
  Filled 2021-01-09: qty 2

## 2021-01-09 MED ORDER — LACTATED RINGERS IV SOLN: INTRAVENOUS | Status: DC

## 2021-01-09 MED ORDER — TENOFOVIR ALAFENAMIDE FUMARATE 25 MG PO TABS
1.0000 | ORAL_TABLET | Freq: Every day | ORAL | 11 refills | Status: DC
  Filled 2021-01-09 – 2021-01-27 (×6): qty 30, 30d supply, fill #0
  Filled 2021-02-19: qty 30, 30d supply, fill #1
  Filled 2021-03-14: qty 30, 30d supply, fill #2
  Filled 2021-04-07: qty 30, 30d supply, fill #3
  Filled 2021-05-05: qty 30, 30d supply, fill #4
  Filled 2021-05-29: qty 30, 30d supply, fill #5
  Filled 2021-06-20: qty 30, 30d supply, fill #6
  Filled 2021-07-16: qty 30, 30d supply, fill #7
  Filled 2021-08-11: qty 30, 30d supply, fill #8
  Filled 2021-08-12: qty 30, 30d supply, fill #0

## 2021-01-09 MED ORDER — ACETAMINOPHEN 500 MG PO TABS: 1000.0000 mg | ORAL_TABLET | Freq: Once | ORAL | Status: DC

## 2021-01-09 MED ORDER — LIDOCAINE-EPINEPHRINE 1 %-1:100000 IJ SOLN
INTRAMUSCULAR | Status: DC | PRN
  Administered 2021-01-09: 20 mL

## 2021-01-09 MED ORDER — FENTANYL CITRATE (PF) 100 MCG/2ML IJ SOLN
INTRAMUSCULAR | Status: DC | PRN
  Administered 2021-01-09: 50 ug via INTRAVENOUS

## 2021-01-09 MED ORDER — POVIDONE-IODINE 10 % EX SWAB: 2.0000 "application " | Freq: Once | CUTANEOUS | Status: DC

## 2021-01-09 MED ORDER — ONDANSETRON HCL 4 MG/2ML IJ SOLN
INTRAMUSCULAR | Status: DC | PRN
  Administered 2021-01-09: 4 mg via INTRAVENOUS

## 2021-01-09 MED ORDER — LEVONORGESTREL 20.1 MCG/DAY IU IUD
INTRAUTERINE_SYSTEM | INTRAUTERINE | Status: AC
  Filled 2021-01-09: qty 1

## 2021-01-09 MED ORDER — FENTANYL CITRATE (PF) 100 MCG/2ML IJ SOLN
25.0000 ug | INTRAMUSCULAR | Status: DC | PRN
  Administered 2021-01-09: 50 ug via INTRAVENOUS

## 2021-01-09 MED ORDER — PROPOFOL 10 MG/ML IV BOLUS
INTRAVENOUS | Status: DC | PRN
  Administered 2021-01-09: 150 mg via INTRAVENOUS

## 2021-01-09 MED ORDER — SODIUM CHLORIDE 0.9 % IR SOLN
Status: DC | PRN
  Administered 2021-01-09: 500 mL

## 2021-01-09 SURGICAL SUPPLY — 22 items
CATH ROBINSON RED A/P 16FR (CATHETERS) ×3 IMPLANT
CNTNR URN SCR LID CUP LEK RST (MISCELLANEOUS) ×2 IMPLANT
CONT SPEC 4OZ STRL OR WHT (MISCELLANEOUS) ×3
DECANTER SPIKE VIAL GLASS SM (MISCELLANEOUS) ×3 IMPLANT
DEVICE MYOSURE LITE (MISCELLANEOUS) IMPLANT
DEVICE MYOSURE REACH (MISCELLANEOUS) IMPLANT
DRSG TELFA 3X8 NADH (GAUZE/BANDAGES/DRESSINGS) ×3 IMPLANT
GAUZE 4X4 16PLY ~~LOC~~+RFID DBL (SPONGE) ×6 IMPLANT
GLOVE SURG LTX SZ7 (GLOVE) ×3 IMPLANT
GLOVE SURG UNDER POLY LF SZ7 (GLOVE) ×6 IMPLANT
GOWN STRL REUS W/ TWL LRG LVL3 (GOWN DISPOSABLE) ×4 IMPLANT
GOWN STRL REUS W/TWL LRG LVL3 (GOWN DISPOSABLE) ×12 IMPLANT
IV NS IRRIG 3000ML ARTHROMATIC (IV SOLUTION) ×3 IMPLANT
KIT PROCEDURE FLUENT (KITS) ×3 IMPLANT
KIT TURNOVER CYSTO (KITS) ×3 IMPLANT
PACK VAGINAL MINOR WOMEN LF (CUSTOM PROCEDURE TRAY) ×3 IMPLANT
PAD OB MATERNITY 4.3X12.25 (PERSONAL CARE ITEMS) ×3 IMPLANT
PAD PREP 24X48 CUFFED NSTRL (MISCELLANEOUS) ×3 IMPLANT
SEAL ROD LENS SCOPE MYOSURE (ABLATOR) ×3 IMPLANT
SLEEVE SCD COMPRESS KNEE MED (STOCKING) ×3 IMPLANT
TOWEL OR 17X26 10 PK STRL BLUE (TOWEL DISPOSABLE) ×6 IMPLANT
liletta iud ×3 IMPLANT

## 2021-01-09 NOTE — Anesthesia Preprocedure Evaluation (Addendum)
Anesthesia Evaluation  Patient identified by MRN, date of birth, ID band Patient awake    Reviewed: Allergy & Precautions, NPO status , Patient's Chart, lab work & pertinent test results  Airway Mallampati: I  TM Distance: >3 FB Neck ROM: Full    Dental  (+) Missing, Dental Advisory Given,    Pulmonary neg pulmonary ROS,    Pulmonary exam normal breath sounds clear to auscultation       Cardiovascular negative cardio ROS Normal cardiovascular exam Rhythm:Regular Rate:Normal     Neuro/Psych  Headaches, negative psych ROS   GI/Hepatic GERD  ,(+) Hepatitis -, B  Endo/Other  negative endocrine ROS  Renal/GU negative Renal ROS  negative genitourinary   Musculoskeletal negative musculoskeletal ROS (+)   Abdominal   Peds  Hematology negative hematology ROS (+)   Anesthesia Other Findings   Reproductive/Obstetrics                            Anesthesia Physical Anesthesia Plan  ASA: 2  Anesthesia Plan: General   Post-op Pain Management:    Induction: Intravenous  PONV Risk Score and Plan: 3 and Ondansetron, Dexamethasone and Midazolam  Airway Management Planned: LMA  Additional Equipment:   Intra-op Plan:   Post-operative Plan: Extubation in OR  Informed Consent: I have reviewed the patients History and Physical, chart, labs and discussed the procedure including the risks, benefits and alternatives for the proposed anesthesia with the patient or authorized representative who has indicated his/her understanding and acceptance.     Dental advisory given  Plan Discussed with: CRNA  Anesthesia Plan Comments:         Anesthesia Quick Evaluation

## 2021-01-09 NOTE — Transfer of Care (Signed)
Immediate Anesthesia Transfer of Care Note  Patient: Brandi Cervantes  Procedure(s) Performed: DILATATION AND CURETTAGE INTRAUTERINE DEVICE (IUD) REMOVAL INTRAUTERINE DEVICE (IUD) INSERTION HYSTEROSCOPY  *POSSIBLE*  Patient Location: PACU  Anesthesia Type:General  Level of Consciousness: drowsy and patient cooperative  Airway & Oxygen Therapy: Patient Spontanous Breathing  Post-op Assessment: Report given to RN and Post -op Vital signs reviewed and stable  Post vital signs: Reviewed and stable  Last Vitals:  Vitals Value Taken Time  BP 114/57 01/09/21 1343  Temp    Pulse 87 01/09/21 1344  Resp 20 01/09/21 1344  SpO2 96 % 01/09/21 1344  Vitals shown include unvalidated device data.  Last Pain:  Vitals:   01/09/21 1153  TempSrc: Oral  PainSc: 0-No pain      Patients Stated Pain Goal: 9 (01/09/21 1153)  Complications: No notable events documented.

## 2021-01-09 NOTE — Interval H&P Note (Signed)
History and Physical Interval Note:  01/09/2021 1:01 PM  Brandi Cervantes  has presented today for surgery, with the diagnosis of Unsuccessful IUD removal.  The various methods of treatment have been discussed with the patient and family. After consideration of risks, benefits and other options for treatment, the patient has consented to  Procedure(s) with comments: DILATATION AND CURETTAGE (N/A) INTRAUTERINE DEVICE (IUD) REMOVAL (N/A) INTRAUTERINE DEVICE (IUD) INSERTION (N/A) - Liletta insertion HYSTEROSCOPY  *POSSIBLE* (N/A) - Possible Hysteroscopy as a surgical intervention.  The patient's history has been reviewed, patient examined, no change in status, stable for surgery.  I have reviewed the patient's chart and labs.  Questions were answered to the patient's satisfaction.     [name]

## 2021-01-09 NOTE — Op Note (Addendum)
Preoperative diagnoses:  retained IUD  Postoperative diagnosis: Same  Procedure: Dilation of cervix, IUD removal, IUD re-insertion  Surgeon: Standley Dakins. Kennon Rounds, MD  Anesthesia: Freddrick March, MD MAC, paracervical block  Findings:  Normal uterus  Estimated blood loss: Minimum  Pathology: Endometrial curettings  Indications for procedure: 25 y.o. [id number] with history of retained IUD who presents for failure to remove in the office. Desire for IUD re-insertion.  Procedure: The patient was taken to the operating room where spinal analgesia was inserted. SCDs were in place.  Time out was performed. Patient was placed in dorsolithotomy in [place]. She was prepped and draped in the usual sterile fashion. A Red Rubber catheter was used to drain her bladder. A speculum was placeed in the vagina. The cervix was visualized anteriorly and grasped with a single-tooth tenaculum. Paracervical block was performed with 1% lidocaine plain with 20 cc injected. The uterus sounded to 9 cm. Dilation was performed with Kennon Rounds dilators. The IUD hook used to remove the IUD. Liletta IUD placed per manufacturers recommendations. All instruments were removed from the vagina. All instrument, needle and lap counts were correct x2. The patient was awakened and is recovering in stable condition.  Donnamae Jude MD 01/09/2021 1:39 PM

## 2021-01-09 NOTE — Anesthesia Procedure Notes (Signed)
Procedure Name: LMA Insertion Date/Time: 01/09/2021 1:24 PM Performed by: Bishop Limbo, CRNA Pre-anesthesia Checklist: Patient identified, Emergency Drugs available, Suction available and Patient being monitored Patient Re-evaluated:Patient Re-evaluated prior to induction Oxygen Delivery Method: Circle System Utilized Preoxygenation: Pre-oxygenation with 100% oxygen Induction Type: IV induction Ventilation: Mask ventilation without difficulty LMA: LMA inserted LMA Size: 4.0 Number of attempts: 1 Placement Confirmation: positive ETCO2 Tube secured with: Tape Dental Injury: Teeth and Oropharynx as per pre-operative assessment

## 2021-01-09 NOTE — Discharge Instructions (Signed)

## 2021-01-10 ENCOUNTER — Encounter (HOSPITAL_BASED_OUTPATIENT_CLINIC_OR_DEPARTMENT_OTHER): Payer: Self-pay | Admitting: Family Medicine

## 2021-01-10 NOTE — Anesthesia Postprocedure Evaluation (Signed)
Anesthesia Post Note  Patient: Dalyah Pla  Procedure(s) Performed: INTRAUTERINE DEVICE (IUD) REMOVAL (Uterus) INTRAUTERINE DEVICE (IUD) INSERTION (Uterus)     Patient location during evaluation: PACU Anesthesia Type: General Level of consciousness: awake and alert Pain management: pain level controlled Vital Signs Assessment: post-procedure vital signs reviewed and stable Respiratory status: spontaneous breathing, nonlabored ventilation, respiratory function stable and patient connected to nasal cannula oxygen Cardiovascular status: blood pressure returned to baseline and stable Postop Assessment: no apparent nausea or vomiting Anesthetic complications: no   No notable events documented.  Last Vitals:  Vitals:   01/09/21 1415 01/09/21 1518  BP: 114/72 100/73  Pulse: 83 64  Resp: (!) 22 18  Temp: (!) 36.3 C 36.6 C  SpO2: 97% 100%    Last Pain:  Vitals:   01/10/21 1141  TempSrc:   PainSc: 0-No pain                 Betania Dizon L Kayron Hicklin

## 2021-01-22 ENCOUNTER — Ambulatory Visit: Payer: Medicaid Other | Admitting: Internal Medicine

## 2021-01-23 ENCOUNTER — Other Ambulatory Visit: Payer: Self-pay

## 2021-01-23 ENCOUNTER — Encounter: Payer: Self-pay | Admitting: Internal Medicine

## 2021-01-23 ENCOUNTER — Ambulatory Visit (INDEPENDENT_AMBULATORY_CARE_PROVIDER_SITE_OTHER): Payer: Medicaid Other | Admitting: Internal Medicine

## 2021-01-23 ENCOUNTER — Other Ambulatory Visit (HOSPITAL_COMMUNITY): Payer: Self-pay

## 2021-01-23 DIAGNOSIS — B181 Chronic viral hepatitis B without delta-agent: Secondary | ICD-10-CM

## 2021-01-23 NOTE — Progress Notes (Signed)
[facility]  Patient Active Problem List   Diagnosis Date Noted   Chronic hepatitis B (HCC) 09/10/2012    Priority: High   Retained intrauterine contraceptive device (IUD) 01/08/2021   Headache 10/03/2020   BMI 34.0-34.9,adult 10/03/2020   DUB (dysfunctional uterine bleeding) 11/13/2016   Low back pain 10/20/2013   GERD (gastroesophageal reflux disease) 09/26/2012   Anemia 09/26/2012   Language barrier, cultural differences 09/10/2012   Supervision of other high-risk pregnancy(V23.89) 09/10/2012   Previous cesarean delivery affecting pregnancy, antepartum 09/07/2012    Patient's Medications  New Prescriptions   No medications on file  Previous Medications   ACETAMINOPHEN (TYLENOL) 325 MG TABLET    Take 325 mg by mouth every 6 (six) hours as needed.   IUD'S IU    by Intrauterine route.   TENOFOVIR ALAFENAMIDE FUMARATE 25 MG TABS    Take 1 tablet (25 mg total) by mouth daily. Must take with food  Modified Medications   No medications on file  Discontinued Medications   No medications on file    Subjective: Zoie is in with her husband for her routine follow-up visit.  She continues her Vemlidy and has been tolerating it well.  They plan to move to [place] in September.  Review of Systems: Review of Systems  Constitutional:  Negative for fever.  Gastrointestinal:  Negative for abdominal pain, diarrhea, nausea and vomiting.  Neurological:  Positive for headaches.   Past Medical History:  Diagnosis Date   COVID 02/2020   fever tired sleepy x 1 week all symptoms resolved   Hepatitis B    diagnosed in pregnancy    Social History   Tobacco Use   Smoking status: Never   Smokeless tobacco: Never  Vaping Use   Vaping Use: Never used  Substance Use Topics   Alcohol use: No   Drug use: No    Family History  Problem Relation Age of Onset   Diabetes Mother    Hypertension Mother    Hypertension Father    Diabetes Father     No  Known Allergies  Objective: Vitals:   01/23/21 0904  BP: 109/73  Pulse: 77  Temp: 97.6 F (36.4 C)  TempSrc: Oral  Weight: 185 lb 9.6 oz (84.2 kg)   Body mass index is 35.07 kg/m.  Physical Exam Constitutional:      Comments: She is in good spirits.  Cardiovascular:     Rate and Rhythm: Normal rate.  Pulmonary:     Effort: Pulmonary effort is normal.    Lab Results Hepatitis B DNA viral load 01/24/2018: 1,010,000 Her viral load has been undetectable at less than 10 since starting Vemlidy in 2019  CMP     Component Value Date/Time   NA 139 12/09/2020 1349   NA 142 01/25/2020 1249   K 4.3 12/09/2020 1349   CL 105 12/09/2020 1349   CO2 27 12/09/2020 1349   GLUCOSE 88 12/09/2020 1349   BUN 11 12/09/2020 1349   BUN 10 01/25/2020 1249   CREATININE 0.70 12/09/2020 1349   CALCIUM 9.4 12/09/2020 1349   PROT 7.1 12/09/2020 1349   PROT 6.7 01/25/2020 1249   ALBUMIN 4.4 01/25/2020 1249   AST 16 12/09/2020 1349   ALT 11 12/09/2020 1349   ALKPHOS 105 01/25/2020 1249   BILITOT 0.4 12/09/2020 1349   BILITOT 0.3 01/25/2020 1249   GFRNONAA 109 01/25/2020 1249   GFRNONAA >89 03/06/2014 1407   GFRAA  126 01/25/2020 1249   GFRAA >89 03/06/2014 1407  Labs 12/09/2020 Hepatitis B surface antigen positive Hepatitis B e antigen negative Hepatitis B DNA viral load less than 10   Problem List Items Addressed This Visit       High   Chronic hepatitis B (HCC)    Her hepatitis B has been completely suppressed since starting Vemlidy in 2019 but she remains surface antigen positive.  I recommend continuing Vemlidy.  I encouraged her to go ahead and find someone in [place] who specializes in hepatitis B care.         Cliffton Asters, MD [facility] [phone] pager   [phone] cell 01/23/2021, 9:33 AM

## 2021-01-23 NOTE — Assessment & Plan Note (Signed)
Her hepatitis B has been completely suppressed since starting Vemlidy in 2019 but she remains surface antigen positive.  I recommend continuing Vemlidy.  I encouraged her to go ahead and find someone in [place] who specializes in hepatitis B care.

## 2021-01-24 ENCOUNTER — Other Ambulatory Visit (HOSPITAL_COMMUNITY): Payer: Self-pay

## 2021-01-27 ENCOUNTER — Telehealth: Payer: Self-pay

## 2021-01-27 ENCOUNTER — Other Ambulatory Visit (HOSPITAL_COMMUNITY): Payer: Self-pay

## 2021-01-27 NOTE — Telephone Encounter (Signed)
[facility] Patient Advocate Encounter   Received notification from [employer]  that prior authorization for Wynonia Sours is required.   PA submitted on 01/27/21 Key [id number] Status is pending    [facility] will continue to follow.   Clearance Coots, CPhT Specialty Pharmacy Patient [facility] Phone: [phone] Fax:  [phone]

## 2021-01-27 NOTE — Telephone Encounter (Signed)
[facility] Patient Advocate Encounter  Prior Authorization for Wynonia Sours has been approved.    PA# [id number] Effective dates: 01/27/21 through 01/27/22  Patients co-pay is $4.00.   [facility] will continue to follow.  Clearance Coots, CPhT Specialty Pharmacy Patient [facility] Phone: [phone] Fax:  [phone]

## 2021-02-19 ENCOUNTER — Other Ambulatory Visit (HOSPITAL_COMMUNITY): Payer: Self-pay

## 2021-02-20 ENCOUNTER — Other Ambulatory Visit (HOSPITAL_COMMUNITY): Payer: Self-pay

## 2021-03-14 ENCOUNTER — Other Ambulatory Visit (HOSPITAL_COMMUNITY): Payer: Self-pay

## 2021-03-17 ENCOUNTER — Other Ambulatory Visit (HOSPITAL_COMMUNITY): Payer: Self-pay

## 2021-04-07 ENCOUNTER — Other Ambulatory Visit (HOSPITAL_COMMUNITY): Payer: Self-pay

## 2021-04-14 ENCOUNTER — Ambulatory Visit (INDEPENDENT_AMBULATORY_CARE_PROVIDER_SITE_OTHER): Payer: Medicaid Other

## 2021-04-14 ENCOUNTER — Other Ambulatory Visit: Payer: Self-pay

## 2021-04-14 ENCOUNTER — Encounter: Payer: Self-pay | Admitting: Family Medicine

## 2021-04-14 ENCOUNTER — Ambulatory Visit (INDEPENDENT_AMBULATORY_CARE_PROVIDER_SITE_OTHER): Payer: Medicaid Other | Admitting: Family Medicine

## 2021-04-14 VITALS — BP 102/69 | HR 90 | Wt 187.0 lb

## 2021-04-14 DIAGNOSIS — Z6835 Body mass index (BMI) 35.0-35.9, adult: Secondary | ICD-10-CM | POA: Insufficient documentation

## 2021-04-14 DIAGNOSIS — B181 Chronic viral hepatitis B without delta-agent: Secondary | ICD-10-CM | POA: Diagnosis present

## 2021-04-14 DIAGNOSIS — Z23 Encounter for immunization: Secondary | ICD-10-CM | POA: Diagnosis present

## 2021-04-14 NOTE — Assessment & Plan Note (Signed)
-  continue routine ID outpatient follow up with Dr. Orvan Falconer -no labs needed at this time, overall most recent labs appropriate  -continue hepatitis B regimen along with continued ID outpatient management

## 2021-04-14 NOTE — Assessment & Plan Note (Signed)
-  extensive diet and exercise counseling provided -nutrition and healthy diet habits handout provided -healthy weight and wellness referral placed

## 2021-04-14 NOTE — Patient Instructions (Signed)
It was great seeing you today!  Today we discussed healthy habits. Please try to exercise at least 3 times a week and stay active as best as you can throughout the day. Eat a balanced diet with plenty of fruits and vegetables.  Please follow up at your next scheduled appointment, if anything arises between now and then, please don't hesitate to contact our office.   Thank you for allowing [place] to be a part of your medical care!  Thank you, Dr. Robyne Peers

## 2021-04-14 NOTE — Progress Notes (Signed)
    SUBJECTIVE:   Patient presents for annual wellness visit. Has history of chronic hepatitis B for which she has routine follow up with outpatient ID, Dr. Orvan Falconer. Follows up every 6 months. Compliant on tenofovir. Recent blood work performed at visit in June 2022.   Today's only concern includes weight management. She wants to lose weight for the purpose of developing healthy habits and prevent obesity. She drinks a lot of coke and does not exercise. She attempts to have a balanced diet but it is not a regular habit she follows.   OBJECTIVE:   BP 102/69   Pulse 90   Wt 187 lb (84.8 kg)   SpO2 95%   BMI 35.33 kg/m   General: Patient well-appearing, in no acute distress. HEENT: normocephalic, atraumatic, non-tender thyroid, no evidence of cervical lymphadenopathy  CV: RRR, no murmurs or gallops auscultated  Resp: CTAB, no wheezing, rales or rhonchi noted Abdomen: soft, nontender, nondistended, no guarding or rebound tenderness noted, presence of bowel sounds Ext: radial pulses strong and equal bilaterally, no LE edema noted bilaterally Neuro: normal gait Psych: mood appropriate   ASSESSMENT/PLAN:   Chronic hepatitis B (HCC) -continue routine ID outpatient follow up with Dr. Orvan Falconer -no labs needed at this time, overall most recent labs appropriate  -continue hepatitis B regimen along with continued ID outpatient management    BMI 35.0-35.9,adult -extensive diet and exercise counseling provided -nutrition and healthy diet habits handout provided -healthy weight and wellness referral placed    -Influenza vaccination administered today, no complications observed.  Reece Leader, DO [facility]

## 2021-05-05 ENCOUNTER — Other Ambulatory Visit (HOSPITAL_COMMUNITY): Payer: Self-pay

## 2021-05-29 ENCOUNTER — Other Ambulatory Visit (HOSPITAL_COMMUNITY): Payer: Self-pay

## 2021-06-20 ENCOUNTER — Other Ambulatory Visit (HOSPITAL_COMMUNITY): Payer: Self-pay

## 2021-06-24 ENCOUNTER — Other Ambulatory Visit (HOSPITAL_COMMUNITY): Payer: Self-pay

## 2021-07-16 ENCOUNTER — Other Ambulatory Visit (HOSPITAL_COMMUNITY): Payer: Self-pay

## 2021-07-17 ENCOUNTER — Other Ambulatory Visit (HOSPITAL_COMMUNITY): Payer: Self-pay

## 2021-08-07 ENCOUNTER — Other Ambulatory Visit (HOSPITAL_COMMUNITY): Payer: Self-pay

## 2021-08-11 ENCOUNTER — Other Ambulatory Visit (HOSPITAL_COMMUNITY): Payer: Self-pay

## 2021-08-12 ENCOUNTER — Other Ambulatory Visit (HOSPITAL_COMMUNITY): Payer: Self-pay

## 2021-08-12 ENCOUNTER — Other Ambulatory Visit: Payer: Self-pay

## 2021-08-13 ENCOUNTER — Other Ambulatory Visit: Payer: Medicaid Other

## 2021-08-14 ENCOUNTER — Other Ambulatory Visit: Payer: Self-pay

## 2021-08-14 ENCOUNTER — Other Ambulatory Visit: Payer: Medicaid Other

## 2021-08-14 ENCOUNTER — Other Ambulatory Visit: Payer: Self-pay | Admitting: Internal Medicine

## 2021-08-14 DIAGNOSIS — B181 Chronic viral hepatitis B without delta-agent: Secondary | ICD-10-CM

## 2021-08-16 LAB — COMPLETE METABOLIC PANEL WITH GFR
AG Ratio: 1.5 (calc) (ref 1.0–2.5)
ALT: 14 U/L (ref 6–29)
AST: 14 U/L (ref 10–30)
Albumin: 4.1 g/dL (ref 3.6–5.1)
Alkaline phosphatase (APISO): 98 U/L (ref 31–125)
BUN: 14 mg/dL (ref 7–25)
CO2: 29 mmol/L (ref 20–32)
Calcium: 9.5 mg/dL (ref 8.6–10.2)
Chloride: 106 mmol/L (ref 98–110)
Creat: 0.71 mg/dL (ref 0.50–0.96)
Globulin: 2.8 g/dL (calc) (ref 1.9–3.7)
Glucose, Bld: 85 mg/dL (ref 65–99)
Potassium: 4.7 mmol/L (ref 3.5–5.3)
Sodium: 142 mmol/L (ref 135–146)
Total Bilirubin: 0.3 mg/dL (ref 0.2–1.2)
Total Protein: 6.9 g/dL (ref 6.1–8.1)
eGFR: 121 mL/min/{1.73_m2} (ref >=60)

## 2021-08-16 LAB — HEPATITIS B E ANTIBODY: Hep B E Ab: REACTIVE — AB

## 2021-08-16 LAB — HEPATITIS B DNA, ULTRAQUANTITATIVE, PCR
Hepatitis B DNA (Calc): <1 Log IU/mL
Hepatitis B DNA: <10 IU/mL

## 2021-08-16 LAB — HEPATITIS B E ANTIGEN: Hep B E Ag: NONREACTIVE

## 2021-08-26 ENCOUNTER — Other Ambulatory Visit: Payer: Self-pay

## 2021-08-26 ENCOUNTER — Other Ambulatory Visit (HOSPITAL_COMMUNITY): Payer: Self-pay

## 2021-08-26 ENCOUNTER — Ambulatory Visit (INDEPENDENT_AMBULATORY_CARE_PROVIDER_SITE_OTHER): Payer: Medicaid Other | Admitting: Internal Medicine

## 2021-08-26 DIAGNOSIS — B181 Chronic viral hepatitis B without delta-agent: Secondary | ICD-10-CM

## 2021-08-26 MED ORDER — TENOFOVIR ALAFENAMIDE FUMARATE 25 MG PO TABS
1.0000 | ORAL_TABLET | Freq: Every day | ORAL | 11 refills | Status: DC
  Filled 2021-08-26 – 2021-09-02 (×2): qty 30, 30d supply, fill #0
  Filled 2021-09-26: qty 30, 30d supply, fill #1
  Filled 2021-10-21: qty 30, 30d supply, fill #2
  Filled 2021-11-12: qty 30, 30d supply, fill #3
  Filled 2021-12-02: qty 30, 30d supply, fill #4
  Filled 2021-12-29: qty 30, 30d supply, fill #5

## 2021-08-26 NOTE — Progress Notes (Signed)
[facility]  Patient Active Problem List   Diagnosis Date Noted   Chronic hepatitis B ([place]) 09/10/2012    Priority: High   BMI 35.0-35.9,adult 04/14/2021   Retained intrauterine contraceptive device (IUD) 01/08/2021   Headache 10/03/2020   BMI 34.0-34.9,adult 10/03/2020   DUB (dysfunctional uterine bleeding) 11/13/2016   Low back pain 10/20/2013   GERD (gastroesophageal reflux disease) 09/26/2012   Anemia 09/26/2012   Language barrier, cultural differences 09/10/2012   Supervision of other high-risk pregnancy(V23.89) 09/10/2012   Previous cesarean delivery affecting pregnancy, antepartum 09/07/2012    Patient's Medications  New Prescriptions   No medications on file  Previous Medications   ACETAMINOPHEN (TYLENOL) 325 MG TABLET    Take 325 mg by mouth every 6 (six) hours as needed.   IUD'S IU    by Intrauterine route.  Modified Medications   Modified Medication Previous Medication   TENOFOVIR ALAFENAMIDE FUMARATE 25 MG TABS Tenofovir Alafenamide Fumarate 25 MG TABS      Take 1 tablet (25 mg total) by mouth daily. Must take with food    Take 1 tablet (25 mg total) by mouth daily. Must take with food  Discontinued Medications   No medications on file    Subjective: [place] is in for her routine follow-up visit.  She and her family were considering moving to [place] but they have held off, wanting to do more research before making a decision to move.  She tells me that she wants to make sure that they will have a better life if they do move.  She tells me that the area of [place] where her family lives is dangerous and they do not feel safe there.  She continues to take [place] and never misses a single dose.  She says that she is somewhat fatigued and has occasional headaches.  She is raising 2 young children.  She is worried that she has gained some weight so she takes her children to the [place] each morning around 5 AM where they swim for 1 to 2  hours.  She is also taking online classes that require 4 to 5 hours each day.  She generally does not after her children go to bed.  Review of Systems: Review of Systems  Constitutional:  Positive for malaise/fatigue. Negative for fever and weight loss.  Gastrointestinal:  Negative for abdominal pain, diarrhea, heartburn, nausea and vomiting.  Neurological:  Positive for headaches.  Psychiatric/Behavioral:  Negative for depression. The patient is not nervous/anxious.        She denies feeling anxious or depressed but says that thinking about moving her family across the world has been stressful.   Past Medical History:  Diagnosis Date   COVID 02/2020   fever tired sleepy x 1 week all symptoms resolved   Hepatitis B    diagnosed in pregnancy    Social History   Tobacco Use   Smoking status: Never   Smokeless tobacco: Never  Vaping Use   Vaping Use: Never used  Substance Use Topics   Alcohol use: No   Drug use: No    Family History  Problem Relation Age of Onset   Diabetes Mother    Hypertension Mother    Hypertension Father    Diabetes Father     No Known Allergies  Objective: Vitals:   08/26/21 1538  BP: 104/73  Pulse: (!) 103  Resp: 16  SpO2: 95%  Weight: 199 lb 11.8  oz (90.6 kg)  Height: 5\' 1"  (1.549 m)   Body mass index is 37.74 kg/m.  Physical Exam Constitutional:      Comments: She is in good spirits.  Abdominal:     General: There is no distension.     Palpations: Abdomen is soft. There is no mass.     Tenderness: There is no abdominal tenderness.  Psychiatric:        Mood and Affect: Mood normal.    Lab Results 08/14/2021 Liver enzymes normal Hepatitis B e antigen negative Hepatitis B e antibody positive Hepatitis B DNA viral load undetectable at less than 10    Problem List Items Addressed This Visit       High   Chronic hepatitis B ([place])    Her chronic hepatitis B is in a lasting remission.  She will continue Vemlidy and follow-up  after lab work in 6 months.      Relevant Medications   Tenofovir Alafenamide Fumarate 25 MG TABS   Other Relevant Orders   Comprehensive metabolic panel   Hepatitis B DNA, ultraquantitative, PCR   Hepatitis B e antibody   Hepatitis B e antigen   Hepatitis B surface antibody,qualitative   Hepatitis B surface antigen     Brandi Bickers, MD [facility] [phone] pager   [phone] cell 08/26/2021, 4:07 PM

## 2021-08-26 NOTE — Assessment & Plan Note (Signed)
Her chronic hepatitis B is in a lasting remission.  She will continue Vemlidy and follow-up after lab work in 6 months.

## 2021-09-02 ENCOUNTER — Other Ambulatory Visit (HOSPITAL_COMMUNITY): Payer: Self-pay

## 2021-09-04 ENCOUNTER — Other Ambulatory Visit (HOSPITAL_COMMUNITY): Payer: Self-pay

## 2021-09-05 ENCOUNTER — Other Ambulatory Visit (HOSPITAL_COMMUNITY): Payer: Self-pay

## 2021-09-11 ENCOUNTER — Other Ambulatory Visit (HOSPITAL_COMMUNITY): Payer: Self-pay

## 2021-09-26 ENCOUNTER — Other Ambulatory Visit (HOSPITAL_COMMUNITY): Payer: Self-pay

## 2021-09-27 ENCOUNTER — Other Ambulatory Visit (HOSPITAL_COMMUNITY): Payer: Self-pay

## 2021-09-30 ENCOUNTER — Other Ambulatory Visit (HOSPITAL_COMMUNITY): Payer: Self-pay

## 2021-09-30 MED ORDER — CHLORHEXIDINE GLUCONATE 0.12 % MT SOLN
OROMUCOSAL | 1 refills | Status: DC
  Filled 2021-09-30: qty 473, 16d supply, fill #0

## 2021-09-30 MED ORDER — IBUPROFEN 600 MG PO TABS
ORAL_TABLET | ORAL | 0 refills | Status: DC
  Filled 2021-09-30: qty 30, 7d supply, fill #0

## 2021-09-30 MED ORDER — PENICILLIN V POTASSIUM 500 MG PO TABS
ORAL_TABLET | ORAL | 0 refills | Status: DC
  Filled 2021-09-30: qty 56, 14d supply, fill #0

## 2021-10-21 ENCOUNTER — Other Ambulatory Visit (HOSPITAL_COMMUNITY): Payer: Self-pay

## 2021-11-12 ENCOUNTER — Encounter: Payer: Self-pay | Admitting: Certified Nurse Midwife

## 2021-11-12 ENCOUNTER — Other Ambulatory Visit (HOSPITAL_COMMUNITY)
Admission: RE | Admit: 2021-11-12 | Discharge: 2021-11-12 | Disposition: A | Discharge status: Home / Self Care | Payer: Medicaid Other | Source: Ambulatory Visit | Attending: Certified Nurse Midwife | Admitting: Certified Nurse Midwife

## 2021-11-12 ENCOUNTER — Ambulatory Visit (INDEPENDENT_AMBULATORY_CARE_PROVIDER_SITE_OTHER): Payer: Medicaid Other | Admitting: Certified Nurse Midwife

## 2021-11-12 ENCOUNTER — Other Ambulatory Visit (HOSPITAL_COMMUNITY): Payer: Self-pay

## 2021-11-12 VITALS — BP 100/62 | HR 81 | Ht 63.0 in | Wt 198.0 lb

## 2021-11-12 DIAGNOSIS — B379 Candidiasis, unspecified: Secondary | ICD-10-CM

## 2021-11-12 DIAGNOSIS — N898 Other specified noninflammatory disorders of vagina: Secondary | ICD-10-CM | POA: Insufficient documentation

## 2021-11-12 DIAGNOSIS — Z01419 Encounter for gynecological examination (general) (routine) without abnormal findings: Secondary | ICD-10-CM | POA: Diagnosis not present

## 2021-11-12 DIAGNOSIS — Z975 Presence of (intrauterine) contraceptive device: Secondary | ICD-10-CM

## 2021-11-12 DIAGNOSIS — Z113 Encounter for screening for infections with a predominantly sexual mode of transmission: Secondary | ICD-10-CM

## 2021-11-12 MED ORDER — TERCONAZOLE 0.4 % VA CREA
1.0000 | TOPICAL_CREAM | Freq: Every day | VAGINAL | 0 refills | Status: DC
  Filled 2021-11-12: qty 45, 7d supply, fill #0

## 2021-11-12 NOTE — Progress Notes (Signed)
? ?ANNUAL EXAM ?Patient name: [place]i [facility] MRN [id number]  Date of birth: 04-Jan-1996 ?Chief Complaint:   ?Vaginal Discharge ? ?History of Present Illness:   ?Brandi C[facility] is a 26 y.o. G63P2002  Asian   female being seen today for a routine annual exam.  ?Current complaints: "Vaginal Itching and burning with white discharge." Pt states "clumpy discharge" that started 2 weeks ago. Pt states that she has not attempted anything to alleviate the symptoms. Pt currently happy with with IUD placement. Currently not having menstrual periods d/t Liletta IUD.  ? ?No LMP recorded. (Menstrual status: IUD). ? ? ?Last pap 01/25/20. Results were: NILM w/ HRHPV not done. H/O abnormal pap: no ?Last mammogram: Not recommended at this time . ?Last colonoscopy: Not recommended at this time.  ? ? ?  11/12/2021  ?  3:23 PM 08/26/2021  ?  3:37 PM 04/14/2021  ?  2:06 PM 12/25/2020  ?  9:01 AM 11/14/2020  ?  5:18 PM  ?Depression screen PHQ 2/9  ?Decreased Interest 2 1 1 1 2   ?Down, Depressed, Hopeless 1  0 1 3  ?PHQ - 2 Score 3 1 1 2 5   ?Altered sleeping 1  0 0 1  ?Tired, decreased energy 2  2 1 1   ?Change in appetite 2  2 1 2   ?Feeling bad or failure about yourself  0  0 0 3  ?Trouble concentrating 0  0 0 3  ?Moving slowly or fidgety/restless 0  0 0 3  ?Suicidal thoughts 0  0 0 3  ?PHQ-9 Score 8  5 4 21   ? ?  ? ?  11/12/2021  ?  3:23 PM 11/14/2020  ?  5:18 PM 09/28/2016  ?  2:58 PM  ?GAD 7 : Generalized Anxiety Score  ?Nervous, Anxious, on Edge 1 3 1   ?Control/stop worrying 1 3 1   ?Worry too much - different things 1 3 0  ?Trouble relaxing 0 3 1  ?Restless 0 3 0  ?Easily annoyed or irritable 0 3 1  ?Afraid - awful might happen 0 3 0  ?Total GAD 7 Score 3 21 4   ? ? ? ?Review of Systems:   ?Pertinent items are noted in HPI ?Denies any headaches, blurred vision, fatigue, shortness of breath, chest pain, abdominal pain, abnormal vaginal discharge/itching/odor/irritation, problems with periods, bowel movements, urination, or intercourse unless otherwise  stated above. ?Pertinent History Reviewed:  ?Reviewed past medical,surgical, social and family history.  ?Reviewed problem list, medications and allergies. ?Physical Assessment:  ? ?Vitals:  ? 11/12/21 1521  ?BP: 100/62  ?Pulse: 81  ?Weight: 198 lb (89.8 kg)  ?Height: 5\' 3"  (1.6 m)  ?Body mass index is 35.07 kg/m?. ?  ?     Physical Examination:  ? General appearance - well appearing, and in no distress ? Mental status - alert, oriented to person, place, and time ? Psych:  She has a stoic mood and  poor affect ? Skin - warm and dry, normal color, no suspicious lesions noted ? Chest - effort normal, all lung fields clear to auscultation bilaterally ? Heart - normal rate and regular rhythm ? Neck:  midline trachea, no thyromegaly or nodules ? Breasts - deferred  ? Abdomen - soft, nontender, nondistended, no masses or organomegaly ? Pelvic - Deferred. + abnormal discharge  ? Thin prep pap is not done ?  Extremities:  No swelling or varicosities noted ? ?Chaperone present for exam ? ?No results found for this or any previous visit (from the  past 24 hour(s)).  ?Assessment & Plan:  ?1) Well-Woman Exam ?- PHQ9- Score of 8  ?- Will recommend behavioral health therapy.  ? ?2) Yeast  ?- Terazole 7 sent to OP pharmacy.  ?- Encouraged to dry off completely after showering and cotton underwear.  ? ?3) STD screening  ?- Pt desires STD screening today ?- Wet prep collected  ?Lab Orders    ?     RPR    ?     HIV Antibody (routine testing w rflx)    ?     Hepatitis C Antibody    ? ?4) Hep B +  ?- Chronic  ?- Continue oral treatment  ? ?Mammogram: @ 26yo, or sooner if problems ?Colonoscopy: @ 26yo, or sooner if problems ? ?Orders Placed This Encounter  ?Procedures  ? RPR  ? HIV Antibody (routine testing w rflx)  ? Hepatitis C Antibody  ? ? ?Meds:  ?Meds ordered this encounter  ?Medications  ? terconazole (TERAZOL 7) 0.4 % vaginal cream  ?  Sig: Place 1 applicator vaginally at bedtime for seven days  ?  Dispense:  45 g  ?  Refill:  0   ?  Order Specific Question:   Supervising Provider  ?  Answer:   [name] [[id number]]  ? ? ?Follow-up: Return in about 1 year (around 11/13/2022) for Annual Exam. ? ?Claudette Head, CNM ?11/12/2021 ?6:12 PM ? ?

## 2021-11-13 ENCOUNTER — Other Ambulatory Visit (HOSPITAL_COMMUNITY): Payer: Self-pay

## 2021-11-13 LAB — HEPATITIS C ANTIBODY: Hep C Virus Ab: NONREACTIVE

## 2021-11-13 LAB — RPR: RPR Ser Ql: NONREACTIVE

## 2021-11-13 LAB — HIV ANTIBODY (ROUTINE TESTING W REFLEX): HIV Screen 4th Generation wRfx: NONREACTIVE

## 2021-11-14 LAB — CERVICOVAGINAL ANCILLARY ONLY
Bacterial Vaginitis (gardnerella): NEGATIVE
Candida Glabrata: NEGATIVE
Candida Vaginitis: NEGATIVE
Chlamydia: NEGATIVE
Comment: NEGATIVE
Comment: NEGATIVE
Comment: NEGATIVE
Comment: NEGATIVE
Comment: NEGATIVE
Comment: NORMAL
Neisseria Gonorrhea: NEGATIVE
Trichomonas: NEGATIVE

## 2021-11-17 ENCOUNTER — Other Ambulatory Visit (HOSPITAL_COMMUNITY): Payer: Self-pay

## 2021-12-02 ENCOUNTER — Other Ambulatory Visit (HOSPITAL_COMMUNITY): Payer: Self-pay

## 2021-12-02 ENCOUNTER — Encounter: Payer: Self-pay | Admitting: *Deleted

## 2021-12-06 ENCOUNTER — Other Ambulatory Visit (HOSPITAL_COMMUNITY): Payer: Self-pay

## 2021-12-06 ENCOUNTER — Other Ambulatory Visit: Payer: Self-pay | Admitting: Certified Nurse Midwife

## 2021-12-06 MED ORDER — TERCONAZOLE 0.4 % VA CREA
1.0000 | TOPICAL_CREAM | Freq: Every day | VAGINAL | 0 refills | Status: AC
  Filled 2021-12-06: qty 45, 7d supply, fill #0

## 2021-12-08 ENCOUNTER — Other Ambulatory Visit (HOSPITAL_COMMUNITY): Payer: Self-pay

## 2021-12-20 ENCOUNTER — Other Ambulatory Visit (HOSPITAL_COMMUNITY): Payer: Self-pay

## 2021-12-26 ENCOUNTER — Other Ambulatory Visit (HOSPITAL_COMMUNITY): Payer: Self-pay

## 2021-12-29 ENCOUNTER — Other Ambulatory Visit (HOSPITAL_COMMUNITY): Payer: Self-pay

## 2022-01-02 ENCOUNTER — Encounter: Payer: Self-pay | Admitting: Family Medicine

## 2022-01-02 ENCOUNTER — Ambulatory Visit (INDEPENDENT_AMBULATORY_CARE_PROVIDER_SITE_OTHER): Payer: Medicaid Other | Admitting: Family Medicine

## 2022-01-02 VITALS — BP 100/69 | HR 95 | Ht 63.0 in | Wt 198.8 lb

## 2022-01-02 DIAGNOSIS — B181 Chronic viral hepatitis B without delta-agent: Secondary | ICD-10-CM | POA: Diagnosis not present

## 2022-01-02 DIAGNOSIS — Z6835 Body mass index (BMI) 35.0-35.9, adult: Secondary | ICD-10-CM | POA: Diagnosis not present

## 2022-01-02 DIAGNOSIS — Z13220 Encounter for screening for lipoid disorders: Secondary | ICD-10-CM

## 2022-01-02 NOTE — Progress Notes (Signed)
fff   SUBJECTIVE:   Chief compliant/HPI: annual examination  Kataya Guimont is a 26 y.o. who presents today for an annual exam. She had elevated PHQ-9 to 27, however she misunderstood question, meant 0. She is not having any symptoms of depression, SI/HI/VAH. She is moving back to [place] with her husband on July 26th.  Chronic hep B: patient has had chronic hep b for many years. She has been in remission and controlled with vemlidy. She sees Dr. Megan Salon at [facility] Her last visit was February 2023, she was scheduled to return for labs in August, but they will be leaving to return to [place] before then. Will get labs today.  Review of systems form not notable.   Updated history tabs and problem list.   OBJECTIVE:   BP 100/69   Pulse 95   Ht '5\' 3"'  (1.6 m)   Wt 198 lb 12.8 oz (90.2 kg)   SpO2 98%   BMI 35.22 kg/m   Nursing note and vitals reviewed GEN: age-appropriate, woman, resting comfortably in chair, NAD, Class II obesity HEENT: NCAT. PERRLA. Sclera without injection or icterus. MMM.  Cardiac: Regular rate and rhythm. Normal S1/S2. No murmurs, rubs, or gallops appreciated. 2+ radial pulses. Lungs: Clear bilaterally to ascultation. No increased WOB, no accessory muscle usage. No w/r/r. Neuro: AOx3  Ext: no edema Psych: Pleasant and appropriate  ASSESSMENT/PLAN:   Chronic hepatitis B ([place]) Patient has been in sustained remission, follows with Dr. Megan Salon. However, she is leaving to return to [place]. Will check labs for her today: CMP, Hep B DNA, Hep B s ag, Hep B s ab, Hep B E ag, Hep B E ab.    Annual Examination  See AVS for age appropriate recommendations.   PHQ score 0, reviewed and discussed. Blood pressure reviewed and at goal .  Asked about intimate partner violence and patient reports none.  The patient currently uses IUD for contraception. Folate recommended as appropriate, minimum of 400 mcg per day.   Considered the following items based upon USPSTF  recommendations: HIV testing:  negative 11/12/21 Hepatitis C:  negative screen 11/12/21 Hepatitis B:  chronic hep B - follows with ID, in remission. Continues on [place] and will follow up with ID in August Syphilis if at high risk:  non-reactive 11/12/21 GC/CT  negative 11/12/21 and not at high risk Lipid panel (nonfasting or fasting) discussed based upon AHA recommendations and ordered.  Consider repeat every 4-6 years.  Reviewed risk factors for latent tuberculosis and not indicated  Discussed family history, BRCA testing not indicated.  Cervical cancer screening: prior Pap reviewed, repeat due in July 2024 Immunizations UTD   Follow up in 1 year or sooner if indicated.    Gladys Damme, MD [place]

## 2022-01-02 NOTE — Patient Instructions (Signed)
It was a pleasure to see you today!  We will get some labs today.  If they are abnormal or we need to do something about them, I will call you.  If they are normal, I will send you a message on MyChart (if it is active) or a letter in the mail.  If you don't hear from [place] in 2 weeks, please call the office  [phone]. I wish you the best of luck in [place] :)    Be Well,  Dr. Leary Roca

## 2022-01-05 ENCOUNTER — Encounter: Payer: Self-pay | Admitting: Family Medicine

## 2022-01-05 LAB — HEPATITIS B DNA, ULTRAQUANTITATIVE, PCR: HBV DNA SERPL PCR-ACNC: NOT DETECTED [IU]/mL

## 2022-01-05 LAB — COMPREHENSIVE METABOLIC PANEL
ALT: 13 IU/L (ref 0–32)
AST: 16 IU/L (ref 0–40)
Albumin/Globulin Ratio: 1.7 (ref 1.2–2.2)
Albumin: 4.5 g/dL (ref 3.9–5.0)
Alkaline Phosphatase: 116 IU/L (ref 44–121)
BUN/Creatinine Ratio: 18 (ref 9–23)
BUN: 12 mg/dL (ref 6–20)
Bilirubin Total: 0.3 mg/dL (ref 0.0–1.2)
CO2: 24 mmol/L (ref 20–29)
Calcium: 9.5 mg/dL (ref 8.7–10.2)
Chloride: 102 mmol/L (ref 96–106)
Creatinine, Ser: 0.65 mg/dL (ref 0.57–1.00)
Globulin, Total: 2.6 g/dL (ref 1.5–4.5)
Glucose: 84 mg/dL (ref 70–99)
Potassium: 4.1 mmol/L (ref 3.5–5.2)
Sodium: 141 mmol/L (ref 134–144)
Total Protein: 7.1 g/dL (ref 6.0–8.5)
eGFR: 125 mL/min/{1.73_m2} (ref >59)

## 2022-01-05 LAB — LIPID PANEL
Chol/HDL Ratio: 3.9 ratio (ref 0.0–4.4)
Cholesterol, Total: 158 mg/dL (ref 100–199)
HDL: 41 mg/dL
LDL Chol Calc (NIH): 84 mg/dL (ref 0–99)
Triglycerides: 197 mg/dL — ABNORMAL HIGH (ref 0–149)
VLDL Cholesterol Cal: 33 mg/dL (ref 5–40)

## 2022-01-05 LAB — HEPATITIS B E ANTIGEN: Hep B E Ag: NEGATIVE

## 2022-01-05 LAB — HEPATITIS B SURFACE ANTIGEN: Hepatitis B Surface Ag: POSITIVE — AB

## 2022-01-05 LAB — HEPATITIS B E ANTIBODY: Hep B E Ab: POSITIVE — AB

## 2022-01-05 NOTE — Assessment & Plan Note (Signed)
Patient has been in sustained remission, follows with Dr. Orvan Falconer. However, she is leaving to return to [place]. Will check labs for her today: CMP, Hep B DNA, Hep B s ag, Hep B s ab, Hep B E ag, Hep B E ab.

## 2022-01-13 ENCOUNTER — Other Ambulatory Visit: Payer: Self-pay | Admitting: Internal Medicine

## 2022-01-13 ENCOUNTER — Other Ambulatory Visit (HOSPITAL_COMMUNITY): Payer: Self-pay

## 2022-01-13 DIAGNOSIS — B181 Chronic viral hepatitis B without delta-agent: Secondary | ICD-10-CM

## 2022-01-13 MED ORDER — TENOFOVIR ALAFENAMIDE FUMARATE 25 MG PO TABS
1.0000 | ORAL_TABLET | Freq: Every day | ORAL | 3 refills | Status: AC
  Filled 2022-01-13: qty 30, 30d supply, fill #0

## 2022-01-13 NOTE — Telephone Encounter (Signed)
Patient moving out of the country ([place]). Can this be filled for a 90 day supply.

## 2022-01-14 ENCOUNTER — Other Ambulatory Visit (HOSPITAL_COMMUNITY): Payer: Self-pay

## 2022-02-03 ENCOUNTER — Other Ambulatory Visit (HOSPITAL_COMMUNITY): Payer: Self-pay

## 2022-02-10 ENCOUNTER — Other Ambulatory Visit: Payer: Medicaid Other

## 2022-02-24 ENCOUNTER — Ambulatory Visit: Payer: Medicaid Other | Admitting: Internal Medicine

## 2023-12-07 ENCOUNTER — Encounter: Payer: Self-pay | Admitting: *Deleted
